# Patient Record
Sex: Female | Born: 1970
Health system: Southern US, Community
[De-identification: ages and names within clinical notes are randomized; demographics above are authoritative.]

## PROBLEM LIST (undated history)

## (undated) DIAGNOSIS — J40 Bronchitis, not specified as acute or chronic: Secondary | ICD-10-CM

## (undated) DIAGNOSIS — G43909 Migraine, unspecified, not intractable, without status migrainosus: Secondary | ICD-10-CM

## (undated) DIAGNOSIS — I10 Essential (primary) hypertension: Secondary | ICD-10-CM

## (undated) DIAGNOSIS — F32A Depression, unspecified: Secondary | ICD-10-CM

## (undated) DIAGNOSIS — E119 Type 2 diabetes mellitus without complications: Secondary | ICD-10-CM

## (undated) HISTORY — DX: Depression, unspecified: F32.A

## (undated) HISTORY — DX: Type 2 diabetes mellitus without complications: E11.9

## (undated) HISTORY — PX: ABDOMINAL HYSTERECTOMY: SHX81

## (undated) HISTORY — PX: TUMOR EXCISION: SHX421

---

## 1995-02-12 DIAGNOSIS — G43809 Other migraine, not intractable, without status migrainosus: Secondary | ICD-10-CM | POA: Insufficient documentation

## 2010-09-13 DIAGNOSIS — I10 Essential (primary) hypertension: Secondary | ICD-10-CM | POA: Insufficient documentation

## 2013-12-17 ENCOUNTER — Emergency Department: Payer: Self-pay | Admitting: Emergency Medicine

## 2014-01-10 ENCOUNTER — Emergency Department (HOSPITAL_COMMUNITY)
Admission: EM | Admit: 2014-01-10 | Discharge: 2014-01-10 | Disposition: A | Discharge status: Home / Self Care | Payer: Managed Care, Other (non HMO) | Attending: Emergency Medicine | Admitting: Emergency Medicine

## 2014-01-10 ENCOUNTER — Encounter (HOSPITAL_COMMUNITY): Payer: Self-pay | Admitting: Emergency Medicine

## 2014-01-10 DIAGNOSIS — F17200 Nicotine dependence, unspecified, uncomplicated: Secondary | ICD-10-CM | POA: Insufficient documentation

## 2014-01-10 DIAGNOSIS — I10 Essential (primary) hypertension: Secondary | ICD-10-CM | POA: Insufficient documentation

## 2014-01-10 DIAGNOSIS — G43909 Migraine, unspecified, not intractable, without status migrainosus: Secondary | ICD-10-CM

## 2014-01-10 HISTORY — DX: Essential (primary) hypertension: I10

## 2014-01-10 MED ORDER — HYDROCODONE-ACETAMINOPHEN 5-325 MG PO TABS
1.0000 | ORAL_TABLET | Freq: Once | ORAL | Status: AC
  Administered 2014-01-10: 1 via ORAL
  Filled 2014-01-10: qty 1

## 2014-01-10 MED ORDER — HYDROCODONE-ACETAMINOPHEN 5-325 MG PO TABS: ORAL_TABLET | ORAL | Status: DC

## 2014-01-10 MED ORDER — METOCLOPRAMIDE HCL 5 MG/ML IJ SOLN
10.0000 mg | Freq: Once | INTRAMUSCULAR | Status: AC
  Administered 2014-01-10: 10 mg via INTRAMUSCULAR
  Filled 2014-01-10: qty 2

## 2014-01-10 MED ORDER — KETOROLAC TROMETHAMINE 60 MG/2ML IM SOLN
60.0000 mg | Freq: Once | INTRAMUSCULAR | Status: AC
  Administered 2014-01-10: 60 mg via INTRAMUSCULAR
  Filled 2014-01-10: qty 2

## 2014-01-10 MED ORDER — OXYCODONE-ACETAMINOPHEN 5-325 MG PO TABS: 1.0000 | ORAL_TABLET | Freq: Once | ORAL | Status: DC

## 2014-01-10 MED ORDER — DIPHENHYDRAMINE HCL 25 MG PO CAPS
25.0000 mg | ORAL_CAPSULE | Freq: Once | ORAL | Status: AC
  Administered 2014-01-10: 25 mg via ORAL
  Filled 2014-01-10: qty 1

## 2014-01-10 NOTE — Discharge Instructions (Signed)
Migraine Headache A migraine headache is an intense, throbbing pain on one or both sides of your head. A migraine can last for 30 minutes to several hours. CAUSES  The exact cause of a migraine headache is not always known. However, a migraine may be caused when nerves in the brain become irritated and release chemicals that cause inflammation. This causes pain. Certain things may also trigger migraines, such as:  Alcohol.  Smoking.  Stress.  Menstruation.  Aged cheeses.  Foods or drinks that contain nitrates, glutamate, aspartame, or tyramine.  Lack of sleep.  Chocolate.  Caffeine.  Hunger.  Physical exertion.  Fatigue.  Medicines used to treat chest pain (nitroglycerine), birth control pills, estrogen, and some blood pressure medicines. SIGNS AND SYMPTOMS  Pain on one or both sides of your head.  Pulsating or throbbing pain.  Severe pain that prevents daily activities.  Pain that is aggravated by any physical activity.  Nausea, vomiting, or both.  Dizziness.  Pain with exposure to bright lights, loud noises, or activity.  General sensitivity to bright lights, loud noises, or smells. Before you get a migraine, you may get warning signs that a migraine is coming (aura). An aura may include:  Seeing flashing lights.  Seeing bright spots, halos, or zig-zag lines.  Having tunnel vision or blurred vision.  Having feelings of numbness or tingling.  Having trouble talking.  Having muscle weakness. DIAGNOSIS  A migraine headache is often diagnosed based on:  Symptoms.  Physical exam.  A CT scan or MRI of your head. These imaging tests cannot diagnose migraines, but they can help rule out other causes of headaches. TREATMENT Medicines may be given for pain and nausea. Medicines can also be given to help prevent recurrent migraines.  HOME CARE INSTRUCTIONS  Only take over-the-counter or prescription medicines for pain or discomfort as directed by your  health care provider. The use of long-term narcotics is not recommended.  Lie down in a dark, quiet room when you have a migraine.  Keep a journal to find out what may trigger your migraine headaches. For example, write down:  What you eat and drink.  How much sleep you get.  Any change to your diet or medicines.  Limit alcohol consumption.  Quit smoking if you smoke.  Get 7 9 hours of sleep, or as recommended by your health care provider.  Limit stress.  Keep lights dim if bright lights bother you and make your migraines worse. SEEK IMMEDIATE MEDICAL CARE IF:   Your migraine becomes severe.  You have a fever.  You have a stiff neck.  You have vision loss.  You have muscular weakness or loss of muscle control.  You start losing your balance or have trouble walking.  You feel faint or pass out.  You have severe symptoms that are different from your first symptoms. MAKE SURE YOU:   Understand these instructions.  Will watch your condition.  Will get help right away if you are not doing well or get worse. Document Released: 08/30/2005 Document Revised: 06/20/2013 Document Reviewed: 05/07/2013 [employer] Patient Information 2014 [employer].  

## 2014-01-10 NOTE — ED Notes (Signed)
C/o over past 2 months headache with no relief to meds. Sensitive to light/sound. Dizziness at times as well. No migraine past 10 years per pt.

## 2014-01-11 NOTE — ED Provider Notes (Signed)
CSN: [id number]     Arrival date & time 01/10/14  1106 History   First MD Initiated Contact with Patient 01/10/14 1201     Chief Complaint  Patient presents with  . Migraine     (Consider location/radiation/quality/duration/timing/severity/associated sxs/prior Treatment) Patient is a 43 y.o. female presenting with migraines. The history is provided by the patient.  Migraine This is a recurrent problem. The current episode started 1 to 4 weeks ago. The problem occurs intermittently. The problem has been waxing and waning. Associated symptoms include headaches and nausea. Pertinent negatives include no abdominal pain, anorexia, chest pain, chills, congestion, coughing, diaphoresis, fever, joint swelling, myalgias, neck pain, numbness, rash, sore throat, swollen glands, urinary symptoms, visual change, vomiting or weakness. Exacerbated by: patient reports increased stress recently.   She has tried NSAIDs, lying down, position changes and ice for the symptoms. The treatment provided mild relief.   Patient with hx of migraine headaches c/o waxing and waning headaches for one month or more.  States headaches feels similar to previous headaches.  She reported increased stress recently at her place of employment .  She denies visual changes, fever, neck pain or stiffness, or numbness or weakness of the extremities.   Past Medical History  Diagnosis Date  . Hypertension    Past Surgical History  Procedure Laterality Date  . Abdominal hysterectomy     No family history on file. History  Substance Use Topics  . Smoking status: Light Tobacco Smoker    Types: Cigarettes  . Smokeless tobacco: Not on file  . Alcohol Use: Yes     Comment: socailly   OB History   Grav Para Term Preterm Abortions TAB SAB Ect Mult Living                 Review of Systems  Constitutional: Negative for fever, chills, diaphoresis, activity change and appetite change.  HENT: Negative for congestion, facial swelling,  sore throat and trouble swallowing.   Eyes: Positive for photophobia. Negative for pain and visual disturbance.  Respiratory: Negative for cough and shortness of breath.   Cardiovascular: Negative for chest pain.  Gastrointestinal: Positive for nausea. Negative for vomiting, abdominal pain and anorexia.  Musculoskeletal: Negative for joint swelling, myalgias, neck pain and neck stiffness.  Skin: Negative for rash and wound.  Neurological: Positive for headaches. Negative for dizziness, facial asymmetry, speech difficulty, weakness and numbness.  Psychiatric/Behavioral: Negative for confusion and decreased concentration.  All other systems reviewed and are negative.     Allergies  Review of patient's allergies indicates no known allergies.  Home Medications   Prior to Admission medications   Medication Sig Start Date End Date Taking? Authorizing Provider  Aspirin-Acetaminophen-Caffeine (GOODY HEADACHE PO) Take 1 Package by mouth daily as needed (migraine).   Yes Historical Provider, MD  butalbital-acetaminophen-caffeine (FIORICET, ESGIC) 50-325-40 MG per tablet Take 1 tablet by mouth 2 (two) times daily as needed for headache.   Yes Historical Provider, MD  ibuprofen (ADVIL,MOTRIN) 200 MG tablet Take 800 mg by mouth every 6 (six) hours as needed for headache.   Yes Historical Provider, MD  HYDROcodone-acetaminophen (NORCO/VICODIN) 5-325 MG per tablet Take one-two tabs po q 4-6 hrs prn pain 01/10/14   Cambree Hendrix L. Lanisa Ishler, PA-C   BP 101/74  Pulse 62  Temp(Src) 98.4 F (36.9 C) (Oral)  Resp 16  Ht 5\' 6"  (1.676 m)  Wt 215 lb (97.523 kg)  BMI 34.72 kg/m2  SpO2 99% Physical Exam  Nursing note and vitals reviewed.  Constitutional: She is oriented to person, place, and time. She appears well-developed and well-nourished. No distress.  HENT:  Head: Normocephalic and atraumatic.  Mouth/Throat: Oropharynx is clear and moist.  Eyes: EOM are normal. Pupils are equal, round, and reactive to  light.  Neck: Normal range of motion and phonation normal. Neck supple. No spinous process tenderness and no muscular tenderness present. No rigidity. No Brudzinski's sign and no Kernig's sign noted.  Cardiovascular: Normal rate, regular rhythm, normal heart sounds and intact distal pulses.   No murmur heard. Pulmonary/Chest: Effort normal and breath sounds normal. No respiratory distress.  Musculoskeletal: Normal range of motion.  Neurological: She is alert and oriented to person, place, and time. She has normal strength. No cranial nerve deficit or sensory deficit. She exhibits normal muscle tone. Coordination and gait normal. GCS eye subscore is 4. GCS verbal subscore is 5. GCS motor subscore is 6.  Reflex Scores:      Tricep reflexes are 2+ on the right side and 2+ on the left side.      Bicep reflexes are 2+ on the right side and 2+ on the left side.      Patellar reflexes are 2+ on the right side and 2+ on the left side.      Achilles reflexes are 2+ on the right side and 2+ on the left side. Skin: Skin is warm and dry.  Psychiatric: She has a normal mood and affect.    ED Course  Procedures (including critical care time) Labs Review Labs Reviewed - No data to display  Imaging Review No results found.   EKG Interpretation None      MDM   Final diagnoses:  Migraine headache   Vitals stable,  Pt is non-toxic appearing.  No focal neuro deficits, no meningeal signs.  Intermittent headache of gradual onset that is similar to previous.  [facility].  Pain improved after medication and appears stable for d/c.  Patient agrees to close f/u with PMD or to return here if the symptoms worsen.     Sparrow [place] L. Trisha Mangleriplett, PA-C 01/11/14 2238

## 2014-01-14 NOTE — ED Provider Notes (Signed)
Medical screening examination/treatment/procedure(s) were performed by non-physician practitioner and as supervising physician I was immediately available for consultation/collaboration.   EKG Interpretation None        Daton Szilagyi L Rainelle Sulewski, MD 01/14/14 1513 

## 2014-01-31 ENCOUNTER — Emergency Department: Payer: Self-pay | Admitting: Emergency Medicine

## 2014-02-28 ENCOUNTER — Emergency Department (HOSPITAL_COMMUNITY)
Admission: EM | Admit: 2014-02-28 | Discharge: 2014-02-28 | Disposition: A | Discharge status: Home / Self Care | Payer: Managed Care, Other (non HMO) | Attending: Emergency Medicine | Admitting: Emergency Medicine

## 2014-02-28 ENCOUNTER — Encounter (HOSPITAL_COMMUNITY): Payer: Self-pay | Admitting: Emergency Medicine

## 2014-02-28 ENCOUNTER — Emergency Department (HOSPITAL_COMMUNITY): Payer: Managed Care, Other (non HMO)

## 2014-02-28 DIAGNOSIS — J40 Bronchitis, not specified as acute or chronic: Secondary | ICD-10-CM

## 2014-02-28 DIAGNOSIS — I10 Essential (primary) hypertension: Secondary | ICD-10-CM | POA: Insufficient documentation

## 2014-02-28 DIAGNOSIS — F17200 Nicotine dependence, unspecified, uncomplicated: Secondary | ICD-10-CM | POA: Insufficient documentation

## 2014-02-28 DIAGNOSIS — Z79899 Other long term (current) drug therapy: Secondary | ICD-10-CM | POA: Insufficient documentation

## 2014-02-28 DIAGNOSIS — J0100 Acute maxillary sinusitis, unspecified: Secondary | ICD-10-CM | POA: Insufficient documentation

## 2014-02-28 DIAGNOSIS — J209 Acute bronchitis, unspecified: Secondary | ICD-10-CM | POA: Insufficient documentation

## 2014-02-28 HISTORY — DX: Migraine, unspecified, not intractable, without status migrainosus: G43.909

## 2014-02-28 HISTORY — DX: Bronchitis, not specified as acute or chronic: J40

## 2014-02-28 MED ORDER — IPRATROPIUM-ALBUTEROL 0.5-2.5 (3) MG/3ML IN SOLN
3.0000 mL | Freq: Once | RESPIRATORY_TRACT | Status: AC
  Administered 2014-02-28: 3 mL via RESPIRATORY_TRACT
  Filled 2014-02-28: qty 3

## 2014-02-28 MED ORDER — HYDROCODONE-ACETAMINOPHEN 5-325 MG PO TABS: ORAL_TABLET | ORAL | Status: DC

## 2014-02-28 MED ORDER — AMOXICILLIN-POT CLAVULANATE 875-125 MG PO TABS: 1.0000 | ORAL_TABLET | Freq: Two times a day (BID) | ORAL | Status: DC

## 2014-02-28 MED ORDER — HYDROCOD POLST-CHLORPHEN POLST 10-8 MG/5ML PO LQCR
5.0000 mL | Freq: Once | ORAL | Status: AC
  Administered 2014-02-28: 5 mL via ORAL

## 2014-02-28 MED ORDER — HYDROCOD POLST-CHLORPHEN POLST 10-8 MG/5ML PO LQCR
ORAL | Status: AC
  Filled 2014-02-28: qty 5

## 2014-02-28 MED ORDER — PREDNISONE 10 MG PO KIT: PACK | ORAL | Status: DC

## 2014-02-28 NOTE — ED Provider Notes (Signed)
CSN: [id number]     Arrival date & time 02/28/14  1026 History   First MD Initiated Contact with Patient 02/28/14 1036     Chief Complaint  Patient presents with  . Nasal Congestion  . Headache  . Cough     (Consider location/radiation/quality/duration/timing/severity/associated sxs/prior Treatment) Patient is a 43 y.o. female presenting with URI. The history is provided by the patient.  URI Presenting symptoms: congestion, cough, fever and sore throat   Presenting symptoms: no ear pain   Severity:  Moderate Onset quality:  Gradual Duration:  3 days Timing:  Constant Progression:  Worsening Chronicity:  New Relieved by:  Nothing Worsened by:  Nothing tried Ineffective treatments:  Decongestant, OTC medications and drinking Associated symptoms: headaches, myalgias, sinus pain, sneezing and wheezing   Risk factors: no diabetes mellitus, no recent travel and no sick contacts    Christinamarie Tall is a 43 y.o. female who presents to the ED with nasal congestion, sinus pressure, sore throat and a cough x 3 days. She reports that the cough is productive. She did not sleep last night for coughing. She has a sinus headache. She denies nausea or vomiting.  Past Medical History  Diagnosis Date  . Hypertension    Past Surgical History  Procedure Laterality Date  . Abdominal hysterectomy     No family history on file. History  Substance Use Topics  . Smoking status: Light Tobacco Smoker    Types: Cigarettes  . Smokeless tobacco: Not on file  . Alcohol Use: Yes     Comment: socailly   OB History   Grav Para Term Preterm Abortions TAB SAB Ect Mult Living                 Review of Systems  Constitutional: Positive for fever. Negative for chills.  HENT: Positive for congestion, sinus pressure, sneezing and sore throat. Negative for ear discharge, ear pain, facial swelling and trouble swallowing.   Eyes: Positive for redness and itching. Negative for visual disturbance.  Respiratory:  Positive for cough, chest tightness, shortness of breath and wheezing.   Cardiovascular: Negative for chest pain.  Musculoskeletal: Positive for myalgias.  Skin: Negative for rash.  Neurological: Positive for headaches.  Psychiatric/Behavioral: Negative for confusion. The patient is not nervous/anxious.       Allergies  Review of patient's allergies indicates no known allergies.  Home Medications   Prior to Admission medications   Medication Sig Start Date End Date Taking? Authorizing Provider  Aspirin-Acetaminophen-Caffeine (GOODY HEADACHE PO) Take 1 Package by mouth daily as needed (migraine).    Historical Provider, MD  butalbital-acetaminophen-caffeine (FIORICET, ESGIC) 50-325-40 MG per tablet Take 1 tablet by mouth 2 (two) times daily as needed for headache.    Historical Provider, MD  HYDROcodone-acetaminophen (NORCO/VICODIN) 5-325 MG per tablet Take one-two tabs po q 4-6 hrs prn pain 01/10/14   Tammy L. Triplett, PA-C  ibuprofen (ADVIL,MOTRIN) 200 MG tablet Take 800 mg by mouth every 6 (six) hours as needed for headache.    Historical Provider, MD   BP 138/80  Pulse 92  Temp(Src) 98.2 F (36.8 C) (Oral)  Resp 17  Ht _0  (1.676 m)  Wt 198 lb (89.812 kg)  BMI 31.97 kg/m2  SpO2 98% Physical Exam  Nursing note and vitals reviewed. Constitutional: She is oriented to person, place, and time. She appears well-developed and well-nourished. No distress.  HENT:  Head: Normocephalic and atraumatic.  Right Ear: Tympanic membrane normal.  Left Ear: Tympanic membrane normal.  Nose: Mucosal edema and rhinorrhea present. Right sinus exhibits maxillary sinus tenderness and frontal sinus tenderness. Left sinus exhibits maxillary sinus tenderness and frontal sinus tenderness.  Mouth/Throat: Uvula is midline and mucous membranes are normal. Posterior oropharyngeal erythema present.  Eyes: Conjunctivae and EOM are normal.  Neck: Neck supple.  Cardiovascular: Normal rate and regular  rhythm.   Pulmonary/Chest: Effort normal. She has decreased breath sounds. Wheezes: occasional. She has rhonchi.  Abdominal: Soft. There is no tenderness.  Musculoskeletal: Normal range of motion.  Neurological: She is alert and oriented to person, place, and time. No cranial nerve deficit.  Skin: Skin is warm and dry.  Psychiatric: She has a normal mood and affect. Her behavior is normal.    ED Course  Procedures  Dg Chest 2 View  02/28/2014   CLINICAL DATA:  Pain.  Cough.  EXAM: CHEST  2 VIEW  COMPARISON:  None.  FINDINGS: Mediastinum and hilar structures are normal. Borderline cardiomegaly with normal pulmonary vascularity. No focal pulmonary infiltrate. No pleural effusion or pneumothorax. No acute bony abnormality .  IMPRESSION: Borderline cardiomegaly, otherwise negative chest.   Electronically Signed   By: Marcello Moores  Register   On: 02/28/2014 11:09   Albuterol/atrovent neb treatment patient states she doesn't feel much different.   MDM  43 y.o. female with sinusitis and bronchitis. Stable for discharge without fever. I have reviewed this patient's vital signs, nurses notes, appropriate imaging and discussed findings and plan of care with the patient. She voices understanding and agrees to plan. She will return for worsening symptoms.    Medication List    STOP taking these medications       GOODY HEADACHE PO      TAKE these medications       amoxicillin-clavulanate 875-125 MG per tablet  Commonly known as:  AUGMENTIN  Take 1 tablet by mouth 2 (two) times daily.     HYDROcodone-acetaminophen 5-325 MG per tablet  Commonly known as:  NORCO/VICODIN  Take one tablet PO every 4 to 6 hours as needed for cough.     PredniSONE 10 MG Kit  Take 6 tablets PO today then 5, 4, 3, 2, 1      ASK your doctor about these medications       acetaminophen 500 MG tablet  Commonly known as:  TYLENOL  Take 1,000 mg by mouth 2 (two) times daily.     fexofenadine 180 MG tablet  Commonly  known as:  ALLEGRA  Take 180 mg by mouth daily.     VISINE OP  Apply 2 drops to eye daily as needed (burning/itching).          [place], NP 02/28/14 1239

## 2014-02-28 NOTE — ED Provider Notes (Signed)
  Medical screening examination/treatment/procedure(s) were performed by non-physician practitioner and as supervising physician I was immediately available for consultation/collaboration.   EKG Interpretation None            Jaclynn Laumann, MD 02/28/14 1404 

## 2014-02-28 NOTE — ED Notes (Signed)
Patient c/o nasal congestion/sinus pressure with sore throat and cough x3 days. Patient reports cough is intermittently productive. Patient also reports headache x1 week. Denies any nausea or vomiting.

## 2014-02-28 NOTE — ED Notes (Signed)
Pt with headache X 1 week. Also complaining of nasal congestion x 2 weeks. Has tried many over the counter remedies but has not experienced any relief. Complains of pressure under eyes and around sinuses as well.

## 2014-02-28 NOTE — Discharge Instructions (Signed)
Do not take the hydrocodone if you are driving as it will make you sleepy.  Return as needed.

## 2014-10-29 ENCOUNTER — Emergency Department: Payer: Self-pay | Admitting: Emergency Medicine

## 2015-01-24 ENCOUNTER — Emergency Department (HOSPITAL_COMMUNITY)
Admission: EM | Admit: 2015-01-24 | Discharge: 2015-01-24 | Disposition: A | Discharge status: Home / Self Care | Payer: Managed Care, Other (non HMO) | Attending: Emergency Medicine | Admitting: Emergency Medicine

## 2015-01-24 ENCOUNTER — Encounter (HOSPITAL_COMMUNITY): Payer: Self-pay | Admitting: *Deleted

## 2015-01-24 DIAGNOSIS — K029 Dental caries, unspecified: Secondary | ICD-10-CM | POA: Diagnosis not present

## 2015-01-24 DIAGNOSIS — J0100 Acute maxillary sinusitis, unspecified: Secondary | ICD-10-CM | POA: Insufficient documentation

## 2015-01-24 DIAGNOSIS — R000 Tachycardia, unspecified: Secondary | ICD-10-CM | POA: Diagnosis not present

## 2015-01-24 DIAGNOSIS — Z79899 Other long term (current) drug therapy: Secondary | ICD-10-CM | POA: Diagnosis not present

## 2015-01-24 DIAGNOSIS — I10 Essential (primary) hypertension: Secondary | ICD-10-CM | POA: Insufficient documentation

## 2015-01-24 DIAGNOSIS — Z87891 Personal history of nicotine dependence: Secondary | ICD-10-CM | POA: Diagnosis not present

## 2015-01-24 DIAGNOSIS — R51 Headache: Secondary | ICD-10-CM | POA: Diagnosis present

## 2015-01-24 MED ORDER — LORATADINE-PSEUDOEPHEDRINE ER 5-120 MG PO TB12: 1.0000 | ORAL_TABLET | Freq: Two times a day (BID) | ORAL | Status: DC

## 2015-01-24 MED ORDER — HYDROCODONE-ACETAMINOPHEN 5-325 MG PO TABS: 1.0000 | ORAL_TABLET | ORAL | Status: DC | PRN

## 2015-01-24 MED ORDER — AMOXICILLIN-POT CLAVULANATE 875-125 MG PO TABS
1.0000 | ORAL_TABLET | Freq: Once | ORAL | Status: AC
  Administered 2015-01-24: 1 via ORAL
  Filled 2015-01-24: qty 1

## 2015-01-24 MED ORDER — ONDANSETRON HCL 4 MG PO TABS
4.0000 mg | ORAL_TABLET | Freq: Once | ORAL | Status: AC
  Administered 2015-01-24: 4 mg via ORAL
  Filled 2015-01-24: qty 1

## 2015-01-24 MED ORDER — HYDROCODONE-ACETAMINOPHEN 5-325 MG PO TABS
2.0000 | ORAL_TABLET | Freq: Once | ORAL | Status: AC
  Administered 2015-01-24: 2 via ORAL
  Filled 2015-01-24: qty 2

## 2015-01-24 MED ORDER — AMOXICILLIN-POT CLAVULANATE 875-125 MG PO TABS: 1.0000 | ORAL_TABLET | Freq: Two times a day (BID) | ORAL | Status: DC

## 2015-01-24 MED ORDER — DEXAMETHASONE 4 MG PO TABS: 4.0000 mg | ORAL_TABLET | Freq: Two times a day (BID) | ORAL | Status: DC

## 2015-01-24 MED ORDER — PREDNISONE 50 MG PO TABS
60.0000 mg | ORAL_TABLET | Freq: Once | ORAL | Status: AC
  Administered 2015-01-24: 60 mg via ORAL
  Filled 2015-01-24 (×2): qty 1

## 2015-01-24 NOTE — Discharge Instructions (Signed)
Please increase fluids. Please use Claritin-D and Decadron daily for your sinus related problem. Please use Augmentin 2 times daily. Please take these medications with food. Please see the your nose and throat specialist listed above, or the ear nose and throat specialist of your choice for an outpatient evaluation of your sinuses. May use Norco for pain. This medication may cause drowsiness, please use with caution. There are multiple dental caries present. Please see your dentist as sone as possible to have these evaluated and managed. Sinusitis Sinusitis is redness, soreness, and inflammation of the paranasal sinuses. Paranasal sinuses are air pockets within the bones of your face (beneath the eyes, the middle of the forehead, or above the eyes). In healthy paranasal sinuses, mucus is able to drain out, and air is able to circulate through them by way of your nose. However, when your paranasal sinuses are inflamed, mucus and air can become trapped. This can allow bacteria and other germs to grow and cause infection. Sinusitis can develop quickly and last only a short time (acute) or continue over a long period (chronic). Sinusitis that lasts for more than 12 weeks is considered chronic.  CAUSES  Causes of sinusitis include:  Allergies.  Structural abnormalities, such as displacement of the cartilage that separates your nostrils (deviated septum), which can decrease the air flow through your nose and sinuses and affect sinus drainage.  Functional abnormalities, such as when the small hairs (cilia) that line your sinuses and help remove mucus do not work properly or are not present. SIGNS AND SYMPTOMS  Symptoms of acute and chronic sinusitis are the same. The primary symptoms are pain and pressure around the affected sinuses. Other symptoms include:  Upper toothache.  Earache.  Headache.  Bad breath.  Decreased sense of smell and taste.  A cough, which worsens when you are lying  flat.  Fatigue.  Fever.  Thick drainage from your nose, which often is green and may contain pus (purulent).  Swelling and warmth over the affected sinuses. DIAGNOSIS  Your health care provider will perform a physical exam. During the exam, your health care provider may:  Look in your nose for signs of abnormal growths in your nostrils (nasal polyps).  Tap over the affected sinus to check for signs of infection.  View the inside of your sinuses (endoscopy) using an imaging device that has a light attached (endoscope). If your health care provider suspects that you have chronic sinusitis, one or more of the following tests may be recommended:  Allergy tests.  Nasal culture. A sample of mucus is taken from your nose, sent to a lab, and screened for bacteria.  Nasal cytology. A sample of mucus is taken from your nose and examined by your health care provider to determine if your sinusitis is related to an allergy. TREATMENT  Most cases of acute sinusitis are related to a viral infection and will resolve on their own within 10 days. Sometimes medicines are prescribed to help relieve symptoms (pain medicine, decongestants, nasal steroid sprays, or saline sprays).  However, for sinusitis related to a bacterial infection, your health care provider will prescribe antibiotic medicines. These are medicines that will help kill the bacteria causing the infection.  Rarely, sinusitis is caused by a fungal infection. In theses cases, your health care provider will prescribe antifungal medicine. For some cases of chronic sinusitis, surgery is needed. Generally, these are cases in which sinusitis recurs more than 3 times per year, despite other treatments. HOME CARE INSTRUCTIONS  Drink plenty of water. Water helps thin the mucus so your sinuses can drain more easily.  Use a humidifier.  Inhale steam 3 to 4 times a day (for example, sit in the bathroom with the shower running).  Apply a warm,  moist washcloth to your face 3 to 4 times a day, or as directed by your health care provider.  Use saline nasal sprays to help moisten and clean your sinuses.  Take medicines only as directed by your health care provider.  If you were prescribed either an antibiotic or antifungal medicine, finish it all even if you start to feel better. SEEK IMMEDIATE MEDICAL CARE IF:  You have increasing pain or severe headaches.  You have nausea, vomiting, or drowsiness.  You have swelling around your face.  You have vision problems.  You have a stiff neck.  You have difficulty breathing. MAKE SURE YOU:   Understand these instructions.  Will watch your condition.  Will get help right away if you are not doing well or get worse. Document Released: 08/30/2005 Document Revised: 01/14/2014 Document Reviewed: 09/14/2011 [facility] Patient Information 2015 [place], [place]. This information is not intended to replace advice given to you by your health care provider. Make sure you discuss any questions you have with your health care provider.  Dental Caries Dental caries is tooth decay. This decay can cause a hole in teeth (cavity) that can get bigger and deeper over time. HOME CARE  Brush and floss your teeth. Do this at least two times a day.  Use a fluoride toothpaste.  Use a mouth rinse if told by your dentist or doctor.  Eat less sugary and starchy foods. Drink less sugary drinks.  Avoid snacking often on sugary and starchy foods. Avoid sipping often on sugary drinks.  Keep regular checkups and cleanings with your dentist.  Use fluoride supplements if told by your dentist or doctor.  Allow fluoride to be applied to teeth if told by your dentist or doctor. Document Released: 06/08/2008 Document Revised: 01/14/2014 Document Reviewed: 09/01/2012 [facility] Patient Information 2015 [place], [place]. This information is not intended to replace advice given to you by your health care provider.  Make sure you discuss any questions you have with your health care provider.

## 2015-01-24 NOTE — ED Provider Notes (Signed)
CSN: [id number]     Arrival date & time 01/24/15  1100 History   First MD Initiated Contact with Patient 01/24/15 1154     Chief Complaint  Patient presents with  . Facial Pain     (Consider location/radiation/quality/duration/timing/severity/associated sxs/prior Treatment) HPI Comments: Patient presents to the emergency department with complaint of nasal congestion, facial pain, cough, and sneezing. The patient states she has had problems with sinuses and allergies in the past. She has not had any temperature elevations reported. There's been no blood in the mucus. She has noted temperature elevation on 3 days ago. She's not had any operations or procedures involving the sinuses or face.  The history is provided by the patient.    Past Medical History  Diagnosis Date  . Hypertension   . Bronchitis   . Migraine    Past Surgical History  Procedure Laterality Date  . Abdominal hysterectomy     Family History  Problem Relation Age of Onset  . Diabetes Other    History  Substance Use Topics  . Smoking status: Former Smoker -- 3 years    Types: Cigarettes  . Smokeless tobacco: Never Used  . Alcohol Use: Yes     Comment: socailly   OB History    No data available     Review of Systems  Constitutional: Positive for fever. Negative for activity change.       All ROS Neg except as noted in HPI  HENT: Positive for congestion, postnasal drip, rhinorrhea and sinus pressure. Negative for nosebleeds.   Eyes: Negative for photophobia and discharge.  Respiratory: Negative for cough, shortness of breath and wheezing.   Cardiovascular: Negative for chest pain and palpitations.  Gastrointestinal: Negative for abdominal pain and blood in stool.  Genitourinary: Negative for dysuria, frequency and hematuria.  Musculoskeletal: Negative for back pain, arthralgias and neck pain.  Skin: Negative.   Neurological: Negative for dizziness, seizures and speech difficulty.   Psychiatric/Behavioral: Negative for hallucinations and confusion.  All other systems reviewed and are negative.     Allergies  Review of patient's allergies indicates no known allergies.  Home Medications   Prior to Admission medications   Medication Sig Start Date End Date Taking? Authorizing Provider  acetaminophen (TYLENOL) 500 MG tablet Take 1,000 mg by mouth daily as needed for headache.    Yes Historical Provider, MD  diphenhydrAMINE (BENADRYL) 25 MG tablet Take 25 mg by mouth every 6 (six) hours as needed (allergic reaction).   Yes Historical Provider, MD  naproxen sodium (ANAPROX) 220 MG tablet Take 220 mg by mouth daily as needed (pain).   Yes Historical Provider, MD  Phenylephrine-DM-GG-APAP 5-10-200-325 MG CAPS Take 2 tablets by mouth daily as needed (allergies).   Yes Historical Provider, MD  pseudoephedrine (SUDAFED) 30 MG tablet Take 30 mg by mouth every 4 (four) hours as needed for congestion.   Yes Historical Provider, MD  Tetrahydrozoline HCl (VISINE OP) Apply 2 drops to eye daily as needed (burning/itching).   Yes Historical Provider, MD  amoxicillin-clavulanate (AUGMENTIN) 875-125 MG per tablet Take 1 tablet by mouth 2 (two) times daily. Patient not taking: Reported on 01/24/2015 02/28/14   Ashley Murrain, NP  HYDROcodone-acetaminophen (NORCO/VICODIN) 5-325 MG per tablet Take one tablet PO every 4 to 6 hours as needed for cough. Patient not taking: Reported on 01/24/2015 02/28/14   Ashley Murrain, NP  PredniSONE 10 MG KIT Take 6 tablets PO today then 5, 4, 3, 2, 1 Patient not taking: Reported  on 01/24/2015 02/28/14   Hope Bunnie Pion, NP   BP 135/62 mmHg  Pulse 80  Temp(Src) 98.2 F (36.8 C) (Oral)  Resp 16  Ht _0  (1.676 m)  Wt 185 lb (83.915 kg)  BMI 29.87 kg/m2  SpO2 100% Physical Exam  Constitutional: She is oriented to person, place, and time. She appears well-developed and well-nourished.  Non-toxic appearance.  HENT:  Head: Normocephalic.  Right Ear: Tympanic  membrane and external ear normal.  Left Ear: Tympanic membrane and external ear normal.  There is pain to percussion of the left maxillary sinus area. There is mild nasal congestion present. There no hot areas over the right or left frontal, or maxillary sinuses.  There is mild increased redness of the posterior pharynx, no exudate noted. Uvula is in the midline.  There are multiple dental caries of the upper and lower jaw on the right and the left. No visible abscess appreciated.  Eyes: EOM and lids are normal. Pupils are equal, round, and reactive to light.  Neck: Normal range of motion. Neck supple. Carotid bruit is not present.  Cardiovascular: Normal rate, regular rhythm, intact distal pulses and normal pulses.   Murmur heard. Pulmonary/Chest: Breath sounds normal. No respiratory distress.  Abdominal: Soft. Bowel sounds are normal. There is no tenderness. There is no guarding.  Musculoskeletal: Normal range of motion.  Lymphadenopathy:       Head (right side): No submandibular adenopathy present.       Head (left side): No submandibular adenopathy present.    She has no cervical adenopathy.  Neurological: She is alert and oriented to person, place, and time. She has normal strength. No cranial nerve deficit or sensory deficit.  Skin: Skin is warm and dry.  Psychiatric: She has a normal mood and affect. Her speech is normal.  Nursing note and vitals reviewed.   ED Course  Procedures (including critical care time) Labs Review Labs Reviewed - No data to display  Imaging Review No results found.   EKG Interpretation None      MDM  Vital signs within normal limits. Patient has pain to percussion of the maxillary sinus area. Question sinus infection, versus dental issue. The airway is patent. There is no evidence for Ludwig's Angina. Patient will be treated with Decadron, Augmentin, Claritin-D, and Norco.    Final diagnoses:  None    **I have reviewed nursing notes, vital  signs, and all appropriate lab and imaging results for this patient.Lily Kocher, PA-C 01/24/15 1219  Fredia Sorrow, MD 01/29/15 1447

## 2015-01-24 NOTE — ED Notes (Signed)
Nasal congestion with lt periorbital swelling,   Fever  3 days ago.  Cough, sneezing

## 2016-04-21 ENCOUNTER — Encounter: Payer: Self-pay | Admitting: *Deleted

## 2016-04-21 ENCOUNTER — Emergency Department
Admission: EM | Admit: 2016-04-21 | Discharge: 2016-04-21 | Disposition: A | Discharge status: Home / Self Care | Payer: Commercial Managed Care - PPO | Attending: Emergency Medicine | Admitting: Emergency Medicine

## 2016-04-21 DIAGNOSIS — Z87891 Personal history of nicotine dependence: Secondary | ICD-10-CM | POA: Insufficient documentation

## 2016-04-21 DIAGNOSIS — I10 Essential (primary) hypertension: Secondary | ICD-10-CM | POA: Diagnosis not present

## 2016-04-21 DIAGNOSIS — K0889 Other specified disorders of teeth and supporting structures: Secondary | ICD-10-CM | POA: Diagnosis not present

## 2016-04-21 MED ORDER — AMOXICILLIN 500 MG PO CAPS: 500.0000 mg | ORAL_CAPSULE | Freq: Three times a day (TID) | ORAL | 0 refills | Status: DC

## 2016-04-21 MED ORDER — IBUPROFEN 600 MG PO TABS: 600.0000 mg | ORAL_TABLET | Freq: Three times a day (TID) | ORAL | 0 refills | Status: DC | PRN

## 2016-04-21 MED ORDER — LIDOCAINE VISCOUS 2 % MT SOLN
15.0000 mL | Freq: Once | OROMUCOSAL | Status: AC
  Administered 2016-04-21: 15 mL via OROMUCOSAL
  Filled 2016-04-21: qty 15

## 2016-04-21 MED ORDER — LIDOCAINE VISCOUS 2 % MT SOLN: 5.0000 mL | Freq: Four times a day (QID) | OROMUCOSAL | 0 refills | Status: AC | PRN

## 2016-04-21 MED ORDER — OXYCODONE-ACETAMINOPHEN 5-325 MG PO TABS: 1.0000 | ORAL_TABLET | Freq: Four times a day (QID) | ORAL | 0 refills | Status: AC | PRN

## 2016-04-21 MED ORDER — OXYCODONE-ACETAMINOPHEN 5-325 MG PO TABS
1.0000 | ORAL_TABLET | Freq: Once | ORAL | Status: AC
  Administered 2016-04-21: 1 via ORAL
  Filled 2016-04-21: qty 1

## 2016-04-21 MED ORDER — IBUPROFEN 600 MG PO TABS
600.0000 mg | ORAL_TABLET | Freq: Once | ORAL | Status: AC
Start: 2016-04-21 — End: 2016-04-21
  Administered 2016-04-21: 600 mg via ORAL
  Filled 2016-04-21: qty 1

## 2016-04-21 NOTE — ED Notes (Signed)
Pt to ed with c/o abscess to mouth and pain x 1 week.  Pt states she recently got insurance.  Swelling noted to gum area on right lower mouth.

## 2016-04-21 NOTE — ED Triage Notes (Signed)
Pt has a abscess surrounding tooth on right side of mouth

## 2016-04-21 NOTE — ED Provider Notes (Signed)
[facility] Emergency Department Provider Note   ____________________________________________   First MD Initiated Contact with Patient 04/21/16 [phone]     (approximate)  I have reviewed the triage vital signs and the nursing notes.   HISTORY  Chief Complaint Dental Problem    HPI Melanie Briggs is a 45 y.o. female complainof pain and dental pain secondary to abscess right lower molar area for one week. Patient stated pain has increased. Patient states she recently obtained health insurance and will follow-up with dentist if we can give her recommendation. Patient stated pain is not relieved with Tylenol. Patient rated the pain as a 10 over 10. No other palliative measures for complaint.   Past Medical History:  Diagnosis Date  . Bronchitis   . Hypertension   . Migraine     There are no active problems to display for this patient.   Past Surgical History:  Procedure Laterality Date  . ABDOMINAL HYSTERECTOMY      Prior to Admission medications   Medication Sig Start Date End Date Taking? Authorizing Provider  acetaminophen (TYLENOL) 500 MG tablet Take 1,000 mg by mouth daily as needed for headache.     Historical Provider, MD  amoxicillin (AMOXIL) 500 MG capsule Take 1 capsule (500 mg total) by mouth 3 (three) times daily. 04/21/16   Sable Feil, PA-C  amoxicillin-clavulanate (AUGMENTIN) 875-125 MG per tablet Take 1 tablet by mouth every 12 (twelve) hours. Take with food 01/24/15   Lily Kocher, PA-C  dexamethasone (DECADRON) 4 MG tablet Take 1 tablet (4 mg total) by mouth 2 (two) times daily with a meal. 01/24/15   Lily Kocher, PA-C  diphenhydrAMINE (BENADRYL) 25 MG tablet Take 25 mg by mouth every 6 (six) hours as needed (allergic reaction).    Historical Provider, MD  HYDROcodone-acetaminophen (NORCO/VICODIN) 5-325 MG per tablet Take 1 tablet by mouth every 4 (four) hours as needed. 01/24/15   Lily Kocher, PA-C  ibuprofen (ADVIL,MOTRIN) 600 MG  tablet Take 1 tablet (600 mg total) by mouth every 8 (eight) hours as needed. 04/21/16   Sable Feil, PA-C  lidocaine (XYLOCAINE) 2 % solution Use as directed 5 mLs in the mouth or throat every 6 (six) hours as needed for mouth pain. 04/21/16   Sable Feil, PA-C  loratadine-pseudoephedrine (CLARITIN-D 12 HOUR) 5-120 MG per tablet Take 1 tablet by mouth 2 (two) times daily. For congestion. 01/24/15   Lily Kocher, PA-C  naproxen sodium (ANAPROX) 220 MG tablet Take 220 mg by mouth daily as needed (pain).    Historical Provider, MD  oxyCODONE-acetaminophen (ROXICET) 5-325 MG tablet Take 1 tablet by mouth every 6 (six) hours as needed. 04/21/16 04/21/17  Sable Feil, PA-C  Phenylephrine-DM-GG-APAP 5-10-200-325 MG CAPS Take 2 tablets by mouth daily as needed (allergies).    Historical Provider, MD  PredniSONE 10 MG KIT Take 6 tablets PO today then 5, 4, 3, 2, 1 Patient not taking: Reported on 01/24/2015 02/28/14   Ashley Murrain, NP  pseudoephedrine (SUDAFED) 30 MG tablet Take 30 mg by mouth every 4 (four) hours as needed for congestion.    Historical Provider, MD  Tetrahydrozoline HCl (VISINE OP) Apply 2 drops to eye daily as needed (burning/itching).    Historical Provider, MD    Allergies Review of patient's allergies indicates no known allergies.  Family History  Problem Relation Age of Onset  . Diabetes Other     Social History Social History  Substance Use Topics  . Smoking  status: Former Smoker    Years: 3.00    Types: Cigarettes  . Smokeless tobacco: Never Used  . Alcohol use Yes     Comment: socailly    Review of Systems Constitutional: No fever/chills Eyes: No visual changes. ENT: No sore throat.Dental pain and swollen gums. Cardiovascular: Denies chest pain. Respiratory: Denies shortness of breath. Gastrointestinal: No abdominal pain.  No nausea, no vomiting.  No diarrhea.  No constipation. Genitourinary: Negative for dysuria. Musculoskeletal: Negative for back pain. Skin:  Negative for rash. Neurological: Negative for headaches, focal weakness or numbness.   ____________________________________________   PHYSICAL EXAM:  VITAL SIGNS: ED Triage Vitals  Enc Vitals Group     BP 04/21/16 0929 (!) 162/96     Pulse Rate 04/21/16 0929 90     Resp 04/21/16 0929 16     Temp 04/21/16 0929 98.7 F (37.1 C)     Temp Source 04/21/16 0929 Oral     SpO2 04/21/16 0929 98 %     Weight 04/21/16 0929 210 lb (95.3 kg)     Height 04/21/16 0929 5' 5"  (1.651 m)     Head Circumference --      Peak Flow --      Pain Score 04/21/16 0919 10     Pain Loc --      Pain Edu? --      Excl. in [place]? --     Constitutional: Alert and oriented. Well appearing and in no acute distress. Eyes: Conjunctivae are normal. PERRL. EOMI. Head: Atraumatic. Nose: No congestion/rhinnorhea. Mouth/Throat: Mucous membranes are moist.  Oropharynx non-erythematous. Neck: No stridor.  {o cervical spine tenderness to palpation Hematological/Lymphatic/Immunilogical: No cervical lymphadenopathy. Cardiovascular: Normal rate, regular rhythm. Grossly normal heart sounds.  Good peripheral circulation. Respiratory: Normal respiratory effort.  No retractions. Lungs CTAB. Gastrointestinal: Soft and nontender. No distention. No abdominal bruits. No CVA tenderness. Musculoskeletal: No lower extremity tenderness nor edema.  No joint effusions. Neurologic:  Normal speech and language. No gross focal neurologic deficits are appreciated. No gait instability. Skin:  Skin is warm, dry and intact. No rash noted. Psychiatric: Mood and affect are normal. Speech and behavior are normal.  ____________________________________________   LABS (all labs ordered are listed, but only abnormal results are displayed)  Labs Reviewed - No data to  display ____________________________________________  EKG   ____________________________________________  RADIOLOGY   ____________________________________________   PROCEDURES  Procedure(s) performed: None  Procedures  Critical Care performed: No  ____________________________________________   INITIAL IMPRESSION / ASSESSMENT AND PLAN / ED COURSE  Pertinent labs & imaging results that were available during my care of the patient were reviewed by me and considered in my medical decision making (see chart for details).  Dental abscess. Patient given discharge care instructions. Patient given a list of dental clinics for follow-up. Patient given a prescription for Percocet, ibuprofen and amoxicillin.  Clinical Course     ____________________________________________   FINAL CLINICAL IMPRESSION(S) / ED DIAGNOSES  Final diagnoses:  None      NEW MEDICATIONS STARTED DURING THIS VISIT:  New Prescriptions   AMOXICILLIN (AMOXIL) 500 MG CAPSULE    Take 1 capsule (500 mg total) by mouth 3 (three) times daily.   IBUPROFEN (ADVIL,MOTRIN) 600 MG TABLET    Take 1 tablet (600 mg total) by mouth every 8 (eight) hours as needed.   LIDOCAINE (XYLOCAINE) 2 % SOLUTION    Use as directed 5 mLs in the mouth or throat every 6 (six) hours as needed for mouth pain.  OXYCODONE-ACETAMINOPHEN (ROXICET) 5-325 MG TABLET    Take 1 tablet by mouth every 6 (six) hours as needed.     Note:  This document was prepared using Dragon voice recognition software and may include unintentional dictation errors.    Sable Feil, PA-C 04/21/16 [place] Quigley, MD 04/21/16 1115

## 2017-08-17 ENCOUNTER — Emergency Department
Admission: EM | Admit: 2017-08-17 | Discharge: 2017-08-17 | Disposition: A | Discharge status: Home / Self Care | Payer: Commercial Managed Care - PPO | Attending: Emergency Medicine | Admitting: Emergency Medicine

## 2017-08-17 ENCOUNTER — Encounter: Payer: Self-pay | Admitting: Emergency Medicine

## 2017-08-17 DIAGNOSIS — R220 Localized swelling, mass and lump, head: Secondary | ICD-10-CM | POA: Insufficient documentation

## 2017-08-17 DIAGNOSIS — Z79899 Other long term (current) drug therapy: Secondary | ICD-10-CM | POA: Insufficient documentation

## 2017-08-17 DIAGNOSIS — R221 Localized swelling, mass and lump, neck: Secondary | ICD-10-CM

## 2017-08-17 DIAGNOSIS — T7840XA Allergy, unspecified, initial encounter: Secondary | ICD-10-CM | POA: Insufficient documentation

## 2017-08-17 DIAGNOSIS — Z87891 Personal history of nicotine dependence: Secondary | ICD-10-CM | POA: Diagnosis not present

## 2017-08-17 DIAGNOSIS — I10 Essential (primary) hypertension: Secondary | ICD-10-CM | POA: Insufficient documentation

## 2017-08-17 MED ORDER — DIPHENHYDRAMINE HCL 50 MG/ML IJ SOLN: 50.0000 mg | Freq: Once | INTRAMUSCULAR | Status: DC

## 2017-08-17 MED ORDER — METHYLPREDNISOLONE SODIUM SUCC 125 MG IJ SOLR
125.0000 mg | Freq: Once | INTRAMUSCULAR | Status: DC
Start: 2017-08-17 — End: 2017-08-17

## 2017-08-17 MED ORDER — FAMOTIDINE 20 MG PO TABS
40.0000 mg | ORAL_TABLET | Freq: Once | ORAL | Status: AC
  Administered 2017-08-17: 40 mg via ORAL
  Filled 2017-08-17: qty 2

## 2017-08-17 MED ORDER — FAMOTIDINE 40 MG PO TABS: 40.0000 mg | ORAL_TABLET | Freq: Every evening | ORAL | 0 refills | Status: DC

## 2017-08-17 MED ORDER — PREDNISONE 20 MG PO TABS: 60.0000 mg | ORAL_TABLET | Freq: Every day | ORAL | 0 refills | Status: DC

## 2017-08-17 MED ORDER — FAMOTIDINE IN NACL 20-0.9 MG/50ML-% IV SOLN: 20.0000 mg | Freq: Once | INTRAVENOUS | Status: DC

## 2017-08-17 MED ORDER — PREDNISONE 20 MG PO TABS
60.0000 mg | ORAL_TABLET | Freq: Once | ORAL | Status: DC
  Filled 2017-08-17: qty 3

## 2017-08-17 NOTE — ED Triage Notes (Signed)
Pt reports she woke with sudden onset of throat swelling and the feeling of choking. Pt sts she had a fudge strip cookie before bed but has had them before. Pt denies SOB but sts she feels her throat is closing. Pt took 25mg  benadryl before arriving to ED. Pt denies sore throat as well as recent cough/congestion. Pts throat is swollen on both sides but no tenderness on neck. Tongue is not swollen.

## 2017-08-17 NOTE — Discharge Instructions (Signed)
Please follow up with your primary care physician for further evaluation of your throat swelling

## 2017-08-17 NOTE — ED Provider Notes (Signed)
[facility] Emergency Department Provider Note   ____________________________________________   First MD Initiated Contact with Patient 08/17/17 [phone]     (approximate)  I have reviewed the triage vital signs and the nursing notes.   HISTORY  Chief Complaint Oral Swelling    HPI Melanie Briggs is a 46 y.o. female who comes into the hospital today with some throat swelling.  She reports that she was sleeping and woke up and felt like she was going to choke.  The patient's girlfriend looked in her throat and states that her tonsils appeared swollen.  She states that she took a Benadryl at home and she feels better currently.  The patient felt at the time that she could not get her breath but she feels better now.  She states that before going to bed she had had some cookies.  She is allergic to coconut but has never had a reaction like this before.  She is here today for evaluation.   Past Medical History:  Diagnosis Date  . Bronchitis   . Hypertension   . Migraine     There are no active problems to display for this patient.   Past Surgical History:  Procedure Laterality Date  . ABDOMINAL HYSTERECTOMY      Prior to Admission medications   Medication Sig Start Date End Date Taking? Authorizing Provider  acetaminophen (TYLENOL) 500 MG tablet Take 1,000 mg by mouth daily as needed for headache.     [provider]  amoxicillin (AMOXIL) 500 MG capsule Take 1 capsule (500 mg total) by mouth 3 (three) times daily. 04/21/16   Sable Feil, PA-C  amoxicillin-clavulanate (AUGMENTIN) 875-125 MG per tablet Take 1 tablet by mouth every 12 (twelve) hours. Take with food 01/24/15   Lily Kocher, PA-C  dexamethasone (DECADRON) 4 MG tablet Take 1 tablet (4 mg total) by mouth 2 (two) times daily with a meal. 01/24/15   Lily Kocher, PA-C  diphenhydrAMINE (BENADRYL) 25 MG tablet Take 25 mg by mouth every 6 (six) hours as needed (allergic reaction).     [provider]  famotidine (PEPCID) 40 MG tablet Take 1 tablet (40 mg total) by mouth every evening. 08/17/17 08/17/18  Loney Hering, MD  HYDROcodone-acetaminophen (NORCO/VICODIN) 5-325 MG per tablet Take 1 tablet by mouth every 4 (four) hours as needed. 01/24/15   Lily Kocher, PA-C  ibuprofen (ADVIL,MOTRIN) 600 MG tablet Take 1 tablet (600 mg total) by mouth every 8 (eight) hours as needed. 04/21/16   Sable Feil, PA-C  lidocaine (XYLOCAINE) 2 % solution Use as directed 5 mLs in the mouth or throat every 6 (six) hours as needed for mouth pain. 04/21/16   Sable Feil, PA-C  loratadine-pseudoephedrine (CLARITIN-D 12 HOUR) 5-120 MG per tablet Take 1 tablet by mouth 2 (two) times daily. For congestion. 01/24/15   Lily Kocher, PA-C  naproxen sodium (ANAPROX) 220 MG tablet Take 220 mg by mouth daily as needed (pain).    [provider]  Phenylephrine-DM-GG-APAP 5-10-200-325 MG CAPS Take 2 tablets by mouth daily as needed (allergies).    [provider]  predniSONE (DELTASONE) 20 MG tablet Take 3 tablets (60 mg total) by mouth daily. 08/17/17   Loney Hering, MD  PredniSONE 10 MG KIT Take 6 tablets PO today then 5, 4, 3, 2, 1 Patient not taking: Reported on 01/24/2015 02/28/14   Ashley Murrain, NP  pseudoephedrine (SUDAFED) 30 MG tablet Take 30 mg by mouth every 4 (  four) hours as needed for congestion.    [provider]  Tetrahydrozoline HCl (VISINE OP) Apply 2 drops to eye daily as needed (burning/itching).    [provider]    Allergies Lisinopril and Naproxen  Family History  Problem Relation Age of Onset  . Diabetes Other     Social History Social History   Tobacco Use  . Smoking status: Former Smoker    Years: 3.00    Types: Cigarettes  . Smokeless tobacco: Never Used  Substance Use Topics  . Alcohol use: Yes    Comment: socailly  . Drug use: No    Review of Systems  Constitutional: No fever/chills Eyes: No visual  changes. ENT: throat swelling Cardiovascular: Denies chest pain. Respiratory: shortness of breath. Gastrointestinal: No abdominal pain.  No nausea, no vomiting.  No diarrhea.  No constipation. Genitourinary: Negative for dysuria. Musculoskeletal: Negative for back pain. Skin: Negative for rash. Neurological: Negative for headaches, focal weakness or numbness.   ____________________________________________   PHYSICAL EXAM:  VITAL SIGNS: ED Triage Vitals  Enc Vitals Group     BP 08/17/17 0211 96/67     Pulse Rate 08/17/17 0209 78     Resp 08/17/17 0209 17     Temp 08/17/17 0209 97.8 F (36.6 C)     Temp Source 08/17/17 0209 Oral     SpO2 08/17/17 0209 98 %     Weight 08/17/17 0210 210 lb (95.3 kg)     Height --      Head Circumference --      Peak Flow --      Pain Score --      Pain Loc --      Pain Edu? --      Excl. in [place]? --     Constitutional: Alert and oriented. Well appearing and in no acute distress. Eyes: Conjunctivae are normal. PERRL. EOMI. Head: Atraumatic. Nose: No congestion/rhinnorhea. Mouth/Throat: Mucous membranes are moist.  Oropharynx non-erythematous. Cardiovascular: Normal rate, regular rhythm. Grossly normal heart sounds.  Good peripheral circulation. Respiratory: Normal respiratory effort.  No retractions. Lungs CTAB. Gastrointestinal: Soft and nontender. No distention. Positive bowel sounds Musculoskeletal: No lower extremity tenderness nor edema.   Neurologic:  Normal speech and language.  Skin:  Skin is warm, dry and intact.  Psychiatric: Mood and affect are normal.   ____________________________________________   LABS (all labs ordered are listed, but only abnormal results are displayed)  Labs Reviewed - No data to display ____________________________________________  EKG  none ____________________________________________  RADIOLOGY  No results  found.  ____________________________________________   PROCEDURES  Procedure(s) performed: None  Procedures  Critical Care performed: No  ____________________________________________   INITIAL IMPRESSION / ASSESSMENT AND PLAN / ED COURSE  As part of my medical decision making, I reviewed the following data within the electronic MEDICAL RECORD NUMBER Notes from prior ED visits and [place] Controlled Substance Database   This is a 46 year old female who comes into the hospital today with some throat swelling.  She took some Benadryl and she states that it is improved.  The patient is not having any wheezing or difficulty breathing.  I will give the patient some prednisone and Pepcid.  It appears that the patient may have had an allergic reaction.  She will be discharged home to follow-up with her primary care physician.      ____________________________________________   FINAL CLINICAL IMPRESSION(S) / ED DIAGNOSES  Final diagnoses:  Throat swelling  Allergic reaction, initial encounter  ED Discharge Orders        Ordered    predniSONE (DELTASONE) 20 MG tablet  Daily     08/17/17 0403    famotidine (PEPCID) 40 MG tablet  Every evening     08/17/17 0403       Note:  This document was prepared using Dragon voice recognition software and may include unintentional dictation errors.    Loney Hering, MD 08/17/17 [phone]

## 2017-08-17 NOTE — ED Notes (Signed)
Pt to the er for a choking feeling that woke her up. Pt says that her girlfriend looked in her throat and saw swelling. No SOB, sore throat, fever, or cough. Tonsils are visible no pain to the neck. Lymph nodes are not swollen. No pain with palpation. Pt took a benadryl prior to leaving. No distress noted.

## 2017-11-24 DIAGNOSIS — G8929 Other chronic pain: Secondary | ICD-10-CM | POA: Insufficient documentation

## 2017-11-24 DIAGNOSIS — M542 Cervicalgia: Secondary | ICD-10-CM | POA: Insufficient documentation

## 2019-07-23 ENCOUNTER — Other Ambulatory Visit: Payer: Self-pay

## 2019-07-23 DIAGNOSIS — Z20822 Contact with and (suspected) exposure to covid-19: Secondary | ICD-10-CM

## 2019-07-24 LAB — NOVEL CORONAVIRUS, NAA

## 2019-11-19 ENCOUNTER — Telehealth: Payer: Self-pay

## 2019-11-19 NOTE — Telephone Encounter (Signed)
Confirmed and screened for in office appt 11/21/19. Melanie Briggs

## 2019-11-21 ENCOUNTER — Encounter: Payer: Self-pay | Admitting: Adult Health

## 2019-11-21 ENCOUNTER — Ambulatory Visit: Payer: 59 | Admitting: Adult Health

## 2019-11-21 ENCOUNTER — Other Ambulatory Visit: Payer: Self-pay

## 2019-11-21 VITALS — BP 118/88 | HR 87 | Temp 97.5°F | Resp 16 | Ht 66.0 in | Wt 189.6 lb

## 2019-11-21 DIAGNOSIS — F321 Major depressive disorder, single episode, moderate: Secondary | ICD-10-CM | POA: Diagnosis not present

## 2019-11-21 DIAGNOSIS — M62838 Other muscle spasm: Secondary | ICD-10-CM | POA: Diagnosis not present

## 2019-11-21 DIAGNOSIS — Z6830 Body mass index (BMI) 30.0-30.9, adult: Secondary | ICD-10-CM | POA: Diagnosis not present

## 2019-11-21 DIAGNOSIS — I10 Essential (primary) hypertension: Secondary | ICD-10-CM | POA: Diagnosis not present

## 2019-11-21 MED ORDER — ESCITALOPRAM OXALATE 20 MG PO TABS: 20.0000 mg | ORAL_TABLET | Freq: Every day | ORAL | 1 refills | Status: DC

## 2019-11-21 MED ORDER — HYDROCHLOROTHIAZIDE 25 MG PO TABS: 25.0000 mg | ORAL_TABLET | Freq: Every day | ORAL | 1 refills | Status: DC

## 2019-11-21 MED ORDER — AMLODIPINE BESYLATE 5 MG PO TABS: 5.0000 mg | ORAL_TABLET | Freq: Every day | ORAL | 0 refills | Status: DC

## 2019-11-21 MED ORDER — CYCLOBENZAPRINE HCL 10 MG PO TABS: 10.0000 mg | ORAL_TABLET | Freq: Three times a day (TID) | ORAL | 0 refills | Status: DC | PRN

## 2019-11-21 NOTE — Progress Notes (Signed)
[facility] [place], [place] [address]  Internal MEDICINE  Office Visit Note  Patient Name: Melanie Briggs  [id number]  [id number]  Date of Service: 11/21/2019   Complaints/HPI Pt is here for establishment of PCP. No chief complaint on file.  HPI Pt is here to establish care.  She is a well appearing 49 yo AA female.  She is single, working for [employer] at this time. She reports a medical history that includes HTN and Migraine headaches. She has seen neurology in the past, and tension headaches. Her bp is initially elevated on arrival. Her mother unexpectedly passed away ealier this year, and she has been having a hard time with this.  She is the oldest of 6 siblings, and she has been having to get everyone through this.  She feels depressed and anxious about her mom being gone.   Current Medication: Outpatient Encounter Medications as of 11/21/2019  Medication Sig  . acetaminophen (TYLENOL) 500 MG tablet Take 1,000 mg by mouth daily as needed for headache.   . ibuprofen (ADVIL,MOTRIN) 600 MG tablet Take 1 tablet (600 mg total) by mouth every 8 (eight) hours as needed.  . lidocaine (XYLOCAINE) 2 % solution Use as directed 5 mLs in the mouth or throat every 6 (six) hours as needed for mouth pain.  Marland Kitchen loratadine-pseudoephedrine (CLARITIN-D 12 HOUR) 5-120 MG per tablet Take 1 tablet by mouth 2 (two) times daily. For congestion.  . pseudoephedrine (SUDAFED) 30 MG tablet Take 30 mg by mouth every 4 (four) hours as needed for congestion.  . Tetrahydrozoline HCl (VISINE OP) Apply 2 drops to eye daily as needed (burning/itching).  Marland Kitchen amoxicillin (AMOXIL) 500 MG capsule Take 1 capsule (500 mg total) by mouth 3 (three) times daily. (Patient not taking: Reported on 11/21/2019)  . amoxicillin-clavulanate (AUGMENTIN) 875-125 MG per tablet Take 1 tablet by mouth every 12 (twelve) hours. Take with food (Patient not taking: Reported on 11/21/2019)  . dexamethasone (DECADRON) 4 MG  tablet Take 1 tablet (4 mg total) by mouth 2 (two) times daily with a meal. (Patient not taking: Reported on 11/21/2019)  . diphenhydrAMINE (BENADRYL) 25 MG tablet Take 25 mg by mouth every 6 (six) hours as needed (allergic reaction).  . famotidine (PEPCID) 40 MG tablet Take 1 tablet (40 mg total) by mouth every evening.  Marland Kitchen HYDROcodone-acetaminophen (NORCO/VICODIN) 5-325 MG per tablet Take 1 tablet by mouth every 4 (four) hours as needed. (Patient not taking: Reported on 11/21/2019)  . naproxen sodium (ANAPROX) 220 MG tablet Take 220 mg by mouth daily as needed (pain).  . Phenylephrine-DM-GG-APAP 5-10-200-325 MG CAPS Take 2 tablets by mouth daily as needed (allergies).  . predniSONE (DELTASONE) 20 MG tablet Take 3 tablets (60 mg total) by mouth daily. (Patient not taking: Reported on 11/21/2019)  . PredniSONE 10 MG KIT Take 6 tablets PO today then 5, 4, 3, 2, 1 (Patient not taking: Reported on 11/21/2019)   No facility-administered encounter medications on file as of 11/21/2019.    Surgical History: Past Surgical History:  Procedure Laterality Date  . ABDOMINAL HYSTERECTOMY    . TUMOR EXCISION N/A     Medical History: Past Medical History:  Diagnosis Date  . Bronchitis   . Hypertension   . Migraine     Family History: Family History  Problem Relation Age of Onset  . Diabetes Other   . Diabetes Mother   . Kidney failure Mother   . Pancreatitis Mother   . Hyperlipidemia Father  Social History   Socioeconomic History  . Marital status: Single    Spouse name: Not on file  . Number of children: Not on file  . Years of education: Not on file  . Highest education level: Not on file  Occupational History  . Not on file  Tobacco Use  . Smoking status: Former Smoker    Years: 3.00    Types: Cigarettes  . Smokeless tobacco: Never Used  Substance and Sexual Activity  . Alcohol use: Yes    Comment: socailly  . Drug use: No  . Sexual activity: Not on file  Other Topics  Concern  . Not on file  Social History Narrative  . Not on file   Social Determinants of Health   Financial Resource Strain:   . Difficulty of Paying Living Expenses: Not on file  Food Insecurity:   . Worried About Charity fundraiser in the Last Year: Not on file  . Ran Out of Food in the Last Year: Not on file  Transportation Needs:   . Lack of Transportation (Medical): Not on file  . Lack of Transportation (Non-Medical): Not on file  Physical Activity:   . Days of Exercise per Week: Not on file  . Minutes of Exercise per Session: Not on file  Stress:   . Feeling of Stress : Not on file  Social Connections:   . Frequency of Communication with Friends and Family: Not on file  . Frequency of Social Gatherings with Friends and Family: Not on file  . Attends Religious Services: Not on file  . Active Member of Clubs or Organizations: Not on file  . Attends Archivist Meetings: Not on file  . Marital Status: Not on file  Intimate Partner Violence:   . Fear of Current or Ex-Partner: Not on file  . Emotionally Abused: Not on file  . Physically Abused: Not on file  . Sexually Abused: Not on file     Review of Systems  Constitutional: Negative for chills, fatigue and unexpected weight change.  HENT: Negative for congestion, rhinorrhea, sneezing and sore throat.   Eyes: Negative for photophobia, pain and redness.  Respiratory: Negative for cough, chest tightness and shortness of breath.   Cardiovascular: Negative for chest pain and palpitations.  Gastrointestinal: Negative for abdominal pain, constipation, diarrhea, nausea and vomiting.  Endocrine: Negative.   Genitourinary: Negative for dysuria and frequency.  Musculoskeletal: Negative for arthralgias, back pain, joint swelling and neck pain.  Skin: Negative for rash.  Allergic/Immunologic: Negative.   Neurological: Negative for tremors and numbness.  Hematological: Negative for adenopathy. Does not bruise/bleed  easily.  Psychiatric/Behavioral: Negative for behavioral problems and sleep disturbance. The patient is not nervous/anxious.     Vital Signs: BP (!) 148/97   Pulse 87   Temp (!) 97.5 F (36.4 C)   Resp 16   Ht _0  (1.676 m)   Wt 189 lb 9.6 oz (86 kg)   SpO2 99%   BMI 30.60 kg/m    Physical Exam Vitals and nursing note reviewed.  Constitutional:      General: She is not in acute distress.    Appearance: She is well-developed. She is not diaphoretic.  HENT:     Head: Normocephalic and atraumatic.     Mouth/Throat:     Pharynx: No oropharyngeal exudate.  Eyes:     Pupils: Pupils are equal, round, and reactive to light.  Neck:     Thyroid: No thyromegaly.  Vascular: No JVD.     Trachea: No tracheal deviation.  Cardiovascular:     Rate and Rhythm: Normal rate and regular rhythm.     Heart sounds: Normal heart sounds. No murmur. No friction rub. No gallop.   Pulmonary:     Effort: Pulmonary effort is normal. No respiratory distress.     Breath sounds: Normal breath sounds. No wheezing or rales.  Chest:     Chest wall: No tenderness.  Abdominal:     Palpations: Abdomen is soft.     Tenderness: There is no abdominal tenderness. There is no guarding.  Musculoskeletal:        General: Normal range of motion.     Cervical back: Normal range of motion and neck supple.  Lymphadenopathy:     Cervical: No cervical adenopathy.  Skin:    General: Skin is warm and dry.  Neurological:     Mental Status: She is alert and oriented to person, place, and time.     Cranial Nerves: No cranial nerve deficit.  Psychiatric:        Behavior: Behavior normal.        Thought Content: Thought content normal.        Judgment: Judgment normal.    Assessment/Plan: 1. Essential hypertension Stable, continue Norvasc and HCTZ as prescribed.  Refill sent for patient. BP at recheck 118/88 - amLODipine (NORVASC) 5 MG tablet; Take 1 tablet (5 mg total) by mouth daily.  Dispense: 30 tablet;  Refill: 0 - hydrochlorothiazide (HYDRODIURIL) 25 MG tablet; Take 1 tablet (25 mg total) by mouth daily.  Dispense: 90 tablet; Refill: 1  2. Depression, major, single episode, moderate (HCC) Start Lexapro, will follow up in 4 weeks.  - escitalopram (LEXAPRO) 20 MG tablet; Take 1 tablet (20 mg total) by mouth daily.  Dispense: 30 tablet; Refill: 1  3. Muscle spasm Refilled patients flexeril for tension and muscle spasm of neck/trapezus muscles. Suspect this is from stress and tension, hopefully lexapro will help with this also.  - cyclobenzaprine (FLEXERIL) 10 MG tablet; Take 1 tablet (10 mg total) by mouth 3 (three) times daily as needed for muscle spasms.  Dispense: 30 tablet; Refill: 0   General Counseling: Yovanna verbalizes understanding of the findings of todays visit and agrees with plan of treatment. I have discussed any further diagnostic evaluation that may be needed or ordered today. We also reviewed her medications today. she has been encouraged to call the office with any questions or concerns that should arise related to todays visit.  No orders of the defined types were placed in this encounter.   No orders of the defined types were placed in this encounter.   Time spent: 40 Minutes   This patient was seen by Orson Gear AGNP-C in Collaboration with Dr Lavera Guise as a part of collaborative care agreement  Kendell Bane AGNP-C Internal Medicine

## 2019-11-29 ENCOUNTER — Telehealth: Payer: Self-pay

## 2019-11-29 NOTE — Telephone Encounter (Signed)
Left a message and asked pt to call and schedule an appointment to discuss and complete FMLA paperwork that she dropped off, per provider she will need an appointment. Beth

## 2019-11-30 ENCOUNTER — Telehealth: Payer: Self-pay

## 2019-11-30 NOTE — Telephone Encounter (Signed)
Left a message and advised patient FMLA paperwork ready for pick up and put copy in scan. Beth

## 2019-12-17 ENCOUNTER — Telehealth: Payer: Self-pay

## 2019-12-17 ENCOUNTER — Other Ambulatory Visit: Payer: Self-pay

## 2019-12-17 DIAGNOSIS — F321 Major depressive disorder, single episode, moderate: Secondary | ICD-10-CM

## 2019-12-17 MED ORDER — ESCITALOPRAM OXALATE 20 MG PO TABS: 20.0000 mg | ORAL_TABLET | Freq: Every day | ORAL | 0 refills | Status: DC

## 2019-12-17 NOTE — Telephone Encounter (Signed)
LMOM TO CONFIRM AND SCREEN FOR 12-19-19 OV. 

## 2019-12-19 ENCOUNTER — Ambulatory Visit: Payer: 59 | Admitting: Adult Health

## 2019-12-31 ENCOUNTER — Telehealth: Payer: Self-pay

## 2019-12-31 NOTE — Telephone Encounter (Signed)
Tried contacting patient to inform of appointment on 01/02/2020 unable to confirm no voicemail. klh

## 2020-01-02 ENCOUNTER — Ambulatory Visit (INDEPENDENT_AMBULATORY_CARE_PROVIDER_SITE_OTHER): Payer: 59 | Admitting: Adult Health

## 2020-01-02 VITALS — Ht 64.0 in | Wt 189.0 lb

## 2020-01-02 DIAGNOSIS — I10 Essential (primary) hypertension: Secondary | ICD-10-CM | POA: Diagnosis not present

## 2020-01-02 DIAGNOSIS — J0100 Acute maxillary sinusitis, unspecified: Secondary | ICD-10-CM | POA: Diagnosis not present

## 2020-01-02 MED ORDER — AZITHROMYCIN 250 MG PO TABS: ORAL_TABLET | ORAL | 0 refills | Status: DC

## 2020-01-02 NOTE — Progress Notes (Signed)
[facility] [place], [place] [address]  Internal MEDICINE  Telephone Visit  Patient Name: Melanie Briggs  [id number]  [id number]  Date of Service: 01/02/2020  I connected with the patient at 1218 by video and verified the patients identity using two identifiers.   I discussed the limitations, risks, security and privacy concerns of performing an evaluation and management service by telephone and the availability of in person appointments. I also discussed with the patient that there may be a patient responsible charge related to the service.  The patient expressed understanding and agrees to proceed.    Chief Complaint  Patient presents with  . Telephone Assessment  . Telephone Screen  . Hypertension    HPI  PT seen via video.  She reports 5 days ago she started feeling bad.  She described head congestion.  She initially thought it was allergies however the symptoms did decline with allegra.  The she noticed a cough, PND and weakness.  She was covid tested today but does  Not have results.  She does report her sense of taste has gone away.      Current Medication: Outpatient Encounter Medications as of 01/02/2020  Medication Sig  . acetaminophen (TYLENOL) 500 MG tablet Take 1,000 mg by mouth daily as needed for headache.   Marland Kitchen amLODipine (NORVASC) 5 MG tablet Take 1 tablet (5 mg total) by mouth daily.  . cyclobenzaprine (FLEXERIL) 10 MG tablet Take 1 tablet (10 mg total) by mouth 3 (three) times daily as needed for muscle spasms.  . diphenhydrAMINE (BENADRYL) 25 MG tablet Take 25 mg by mouth every 6 (six) hours as needed (allergic reaction).  Marland Kitchen escitalopram (LEXAPRO) 20 MG tablet Take 1 tablet (20 mg total) by mouth daily.  . hydrochlorothiazide (HYDRODIURIL) 25 MG tablet Take 1 tablet (25 mg total) by mouth daily.  Marland Kitchen ibuprofen (ADVIL,MOTRIN) 600 MG tablet Take 1 tablet (600 mg total) by mouth every 8 (eight) hours as needed.  . lidocaine (XYLOCAINE) 2 %  solution Use as directed 5 mLs in the mouth or throat every 6 (six) hours as needed for mouth pain.  Marland Kitchen loratadine-pseudoephedrine (CLARITIN-D 12 HOUR) 5-120 MG per tablet Take 1 tablet by mouth 2 (two) times daily. For congestion.  . naproxen sodium (ANAPROX) 220 MG tablet Take 220 mg by mouth daily as needed (pain).  . Phenylephrine-DM-GG-APAP 5-10-200-325 MG CAPS Take 2 tablets by mouth daily as needed (allergies).  . pseudoephedrine (SUDAFED) 30 MG tablet Take 30 mg by mouth every 4 (four) hours as needed for congestion.  . Tetrahydrozoline HCl (VISINE OP) Apply 2 drops to eye daily as needed (burning/itching).  . famotidine (PEPCID) 40 MG tablet Take 1 tablet (40 mg total) by mouth every evening.  . [DISCONTINUED] amoxicillin (AMOXIL) 500 MG capsule Take 1 capsule (500 mg total) by mouth 3 (three) times daily. (Patient not taking: Reported on 11/21/2019)  . [DISCONTINUED] amoxicillin-clavulanate (AUGMENTIN) 875-125 MG per tablet Take 1 tablet by mouth every 12 (twelve) hours. Take with food (Patient not taking: Reported on 11/21/2019)  . [DISCONTINUED] dexamethasone (DECADRON) 4 MG tablet Take 1 tablet (4 mg total) by mouth 2 (two) times daily with a meal. (Patient not taking: Reported on 11/21/2019)  . [DISCONTINUED] HYDROcodone-acetaminophen (NORCO/VICODIN) 5-325 MG per tablet Take 1 tablet by mouth every 4 (four) hours as needed. (Patient not taking: Reported on 11/21/2019)  . [DISCONTINUED] predniSONE (DELTASONE) 20 MG tablet Take 3 tablets (60 mg total) by mouth daily. (Patient not taking: Reported  on 11/21/2019)  . [DISCONTINUED] PredniSONE 10 MG KIT Take 6 tablets PO today then 5, 4, 3, 2, 1 (Patient not taking: Reported on 11/21/2019)   No facility-administered encounter medications on file as of 01/02/2020.    Surgical History: Past Surgical History:  Procedure Laterality Date  . ABDOMINAL HYSTERECTOMY    . TUMOR EXCISION N/A     Medical History: Past Medical History:  Diagnosis Date   . Bronchitis   . Hypertension   . Migraine     Family History: Family History  Problem Relation Age of Onset  . Diabetes Other   . Diabetes Mother   . Kidney failure Mother   . Pancreatitis Mother   . Hyperlipidemia Father     Social History   Socioeconomic History  . Marital status: Single    Spouse name: Not on file  . Number of children: Not on file  . Years of education: Not on file  . Highest education level: Not on file  Occupational History  . Not on file  Tobacco Use  . Smoking status: Former Smoker    Years: 3.00    Types: Cigarettes  . Smokeless tobacco: Never Used  Substance and Sexual Activity  . Alcohol use: Yes    Comment: socailly  . Drug use: No  . Sexual activity: Not on file  Other Topics Concern  . Not on file  Social History Narrative  . Not on file   Social Determinants of Health   Financial Resource Strain:   . Difficulty of Paying Living Expenses:   Food Insecurity:   . Worried About Charity fundraiser in the Last Year:   . Arboriculturist in the Last Year:   Transportation Needs:   . Film/video editor (Medical):   Marland Kitchen Lack of Transportation (Non-Medical):   Physical Activity:   . Days of Exercise per Week:   . Minutes of Exercise per Session:   Stress:   . Feeling of Stress :   Social Connections:   . Frequency of Communication with Friends and Family:   . Frequency of Social Gatherings with Friends and Family:   . Attends Religious Services:   . Active Member of Clubs or Organizations:   . Attends Archivist Meetings:   Marland Kitchen Marital Status:   Intimate Partner Violence:   . Fear of Current or Ex-Partner:   . Emotionally Abused:   Marland Kitchen Physically Abused:   . Sexually Abused:       Review of Systems  Constitutional: Negative for chills, fatigue and unexpected weight change.  HENT: Negative for congestion, rhinorrhea, sneezing and sore throat.   Eyes: Negative for photophobia, pain and redness.  Respiratory:  Positive for cough. Negative for chest tightness and shortness of breath.   Cardiovascular: Negative for chest pain and palpitations.  Gastrointestinal: Negative for abdominal pain, constipation, diarrhea, nausea and vomiting.  Endocrine: Negative.   Genitourinary: Negative for dysuria and frequency.  Musculoskeletal: Negative for arthralgias, back pain, joint swelling and neck pain.  Skin: Negative for rash.  Allergic/Immunologic: Negative.   Neurological: Negative for tremors and numbness.  Hematological: Negative for adenopathy. Does not bruise/bleed easily.  Psychiatric/Behavioral: Negative for behavioral problems and sleep disturbance. The patient is not nervous/anxious.     Vital Signs: Ht 5' 4"  (1.626 m)   Wt 189 lb (85.7 kg)   BMI 32.44 kg/m    Observation/Objective:  Well sounding, hoarse voice. NAD noted   Assessment/Plan: 1. Acute non-recurrent maxillary sinusitis  Advised patient to take entire course of antibiotics as prescribed with food. Pt should return to clinic in 7-10 days if symptoms fail to improve or new symptoms develop. She will let us know status of covid test. - azithromycin (ZITHROMAX) 250 MG tablet; Take as directed.  Dispense: 6 tablet; Refill: 0  2. Essential hypertension Continue present meds, and continue to monitor pressure.   General Counseling: Trevia verbalizes understanding of the findings of today's phone visit and agrees with plan of treatment. I have discussed any further diagnostic evaluation that may be needed or ordered today. We also reviewed her medications today. she has been encouraged to call the office with any questions or concerns that should arise related to todays visit.    No orders of the defined types were placed in this encounter.   No orders of the defined types were placed in this encounter.   Time spent: [place] [facility] Internal medicine

## 2020-01-04 ENCOUNTER — Telehealth: Payer: Self-pay

## 2020-01-04 NOTE — Telephone Encounter (Signed)
Pt called that she is positive for covid as per adam advised take zpak and if coughing or Sob call us back or if worse she need go to ED and also advised she can rest and  Drink plenty of water

## 2020-07-11 DIAGNOSIS — F32A Depression, unspecified: Secondary | ICD-10-CM | POA: Insufficient documentation

## 2020-07-22 ENCOUNTER — Other Ambulatory Visit: Payer: Self-pay

## 2020-07-22 ENCOUNTER — Encounter: Payer: Self-pay | Admitting: Hospice and Palliative Medicine

## 2020-07-22 ENCOUNTER — Ambulatory Visit: Payer: No Typology Code available for payment source | Admitting: Hospice and Palliative Medicine

## 2020-07-22 DIAGNOSIS — G479 Sleep disorder, unspecified: Secondary | ICD-10-CM

## 2020-07-22 DIAGNOSIS — I10 Essential (primary) hypertension: Secondary | ICD-10-CM | POA: Diagnosis not present

## 2020-07-22 DIAGNOSIS — F331 Major depressive disorder, recurrent, moderate: Secondary | ICD-10-CM

## 2020-07-22 MED ORDER — MIRTAZAPINE 7.5 MG PO TABS: 7.5000 mg | ORAL_TABLET | Freq: Every day | ORAL | 0 refills | Status: DC

## 2020-07-22 NOTE — Progress Notes (Signed)
[facility] [address] [place], [place] [address]  Internal MEDICINE  Office Visit Note  Patient Name: Melanie Briggs  [id number]  [id number]  Date of Service: 07/23/2020  Chief Complaint  Patient presents with  . Follow-up    Fmla  . 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 acetaminophen (TYLENOL) 500 MG tablet Take 1,000 mg by mouth daily as needed for headache.   Marland Kitchen amLODipine (NORVASC) 5 MG tablet Take 1 tablet (5 mg total) by mouth daily.  . cyclobenzaprine (FLEXERIL) 10 MG tablet Take 1 tablet (10 mg total) by mouth 3 (three) times daily as needed for muscle spasms.  . diphenhydrAMINE (BENADRYL) 25 MG tablet Take 25 mg by mouth every 6 (six) hours as needed (allergic reaction).  Marland Kitchen escitalopram (LEXAPRO) 20 MG tablet Take 1 tablet (20 mg total) by mouth daily.  . hydrochlorothiazide (HYDRODIURIL) 25 MG tablet Take 1 tablet (25 mg total) by mouth daily.  Marland Kitchen ibuprofen (ADVIL,MOTRIN) 600 MG tablet Take 1 tablet (600 mg total) by mouth every 8 (eight) hours as needed.  . lidocaine (XYLOCAINE) 2 % solution Use as directed 5  mLs in the mouth or throat every 6 (six) hours as needed for mouth pain.  Marland Kitchen loratadine-pseudoephedrine (CLARITIN-D 12 HOUR) 5-120 MG per tablet Take 1 tablet by mouth 2 (two) times daily. For congestion.  . naproxen sodium (ANAPROX) 220 MG tablet Take 220 mg by mouth daily as needed (pain).  . Phenylephrine-DM-GG-APAP 5-10-200-325 MG CAPS Take 2 tablets by mouth daily as needed (allergies).  . pseudoephedrine (SUDAFED) 30 MG tablet Take 30 mg by mouth every 4 (four) hours as needed for congestion.  . Tetrahydrozoline HCl (VISINE OP) Apply 2 drops to eye daily as needed (burning/itching).  . famotidine (PEPCID) 40 MG tablet Take 1 tablet (40 mg total) by mouth every evening.  . mirtazapine (REMERON) 7.5 MG tablet Take 1 tablet (7.5 mg total) by mouth at bedtime.  . [DISCONTINUED] azithromycin (ZITHROMAX) 250 MG tablet Take as directed.   No facility-administered encounter medications on file as of 07/22/2020.    Surgical History: Past Surgical History:  Procedure Laterality Date  . ABDOMINAL HYSTERECTOMY    . TUMOR EXCISION N/A     Medical History: Past Medical History:  Diagnosis Date  . Bronchitis   . Hypertension   . Migraine     Family History: Family History  Problem Relation Age of Onset  . Diabetes Other   . Diabetes Mother   . Kidney failure Mother   . Pancreatitis Mother   . Hyperlipidemia Father     Social History   Socioeconomic History  . Marital status: Single  Spouse name: Not on file  . Number of children: Not on file  . Years of education: Not on file  . Highest education level: Not on file  Occupational History  . Not on file  Tobacco Use  . Smoking status: Former Smoker    Years: 3.00    Types: Cigarettes  . Smokeless tobacco: Never Used  Substance and Sexual Activity  . Alcohol use: Yes    Comment: socailly  . Drug use: No  . Sexual activity: Not on file  Other Topics Concern  . Not on file  Social History Narrative  . Not on file    Social Determinants of Health   Financial Resource Strain:   . Difficulty of Paying Living Expenses: Not on file  Food Insecurity:   . Worried About Programme researcher, broadcasting/film/video in the Last Year: Not on file  . Ran Out of Food in the Last Year: Not on file  Transportation Needs:   . Lack of Transportation (Medical): Not on file  . Lack of Transportation (Non-Medical): Not on file  Physical Activity:   . Days of Exercise per Week: Not on file  . Minutes of Exercise per Session: Not on file  Stress:   . Feeling of Stress : Not on file  Social Connections:   . Frequency of Communication with Friends and Family: Not on file  . Frequency of Social Gatherings with Friends and Family: Not on file  . Attends Religious Services: Not on file  . Active Member of Clubs or Organizations: Not on file  . Attends Banker Meetings: Not on file  . Marital Status: Not on file  Intimate Partner Violence:   . Fear of Current or Ex-Partner: Not on file  . Emotionally Abused: Not on file  . Physically Abused: Not on file  . Sexually Abused: Not on file      Review of Systems  Constitutional: Negative for chills, diaphoresis and fatigue.  HENT: Negative for ear pain, postnasal drip and sinus pressure.   Eyes: Negative for photophobia, discharge, redness, itching and visual disturbance.  Respiratory: Negative for cough, shortness of breath and wheezing.   Cardiovascular: Negative for chest pain, palpitations and leg swelling.  Gastrointestinal: Negative for abdominal pain, constipation, diarrhea, nausea and vomiting.  Genitourinary: Negative for dysuria and flank pain.  Musculoskeletal: Negative for arthralgias, back pain, gait problem and neck pain.  Skin: Negative for color change.  Allergic/Immunologic: Negative for environmental allergies and food allergies.  Neurological: Negative for dizziness and headaches.  Hematological: Does not bruise/bleed easily.  Psychiatric/Behavioral:  Positive for decreased concentration and sleep disturbance. Negative for agitation, behavioral problems (depression) and hallucinations.       Depression    Vital Signs: BP 128/90   Pulse 95   Temp 97.8 F (36.6 C)   Ht 5\' 6"  (1.676 m)   Wt 194 lb (88 kg)   SpO2 99%   BMI 31.31 kg/m    Physical Exam Vitals reviewed.  Constitutional:      Appearance: Normal appearance. She is obese.  Cardiovascular:     Rate and Rhythm: Normal rate and regular rhythm.     Pulses: Normal pulses.     Heart sounds: Normal heart sounds.  Pulmonary:     Effort: Pulmonary effort is normal.     Breath sounds: Normal breath sounds.  Musculoskeletal:     Cervical back: Normal range of motion.  Skin:    General: Skin is warm.  Neurological:  General: No focal deficit present.     Mental Status: She is alert and oriented to person, place, and time. Mental status is at baseline.  Psychiatric:        Mood and Affect: Mood normal.        Behavior: Behavior normal.        Thought Content: Thought content normal.    Assessment/Plan: 1. Moderate episode of recurrent major depressive disorder (HCC) Continue with lexapro as she feels this is controlling her symptoms at this time Continues to have intermittent episodes of sadness--these have improved since starting lexapro  2. Essential hypertension BP and HR well controlled at this time  3. Sleep disturbance Headaches likely secondary to sleep disturbance Will try low dose remeron to help with sleep If sleep continues to be an issue may need to consider sleep study for potential OSA - mirtazapine (REMERON) 7.5 MG tablet; Take 1 tablet (7.5 mg total) by mouth at bedtime.  Dispense: 60 tablet; Refill: 0  General Counseling: Akayla verbalizes understanding of the findings of todays visit and agrees with plan of treatment. I have discussed any further diagnostic evaluation that may be needed or ordered today. We also reviewed her medications today.  she has been encouraged to call the office with any questions or concerns that should arise related to todays visit.   Meds ordered this encounter  Medications  . mirtazapine (REMERON) 7.5 MG tablet    Sig: Take 1 tablet (7.5 mg total) by mouth at bedtime.    Dispense:  60 tablet    Refill:  0    Time spent: 30 Minutes Time spent includes review of chart, medications, test results and follow-up plan with the patient.  This patient was seen by Leeanne Deed AGNP-C in Collaboration with Dr Lyndon Code as a part of collaborative care agreement     Melanie Briggs. Melanie Briggs AGNP-C Internal medicine

## 2020-07-23 ENCOUNTER — Encounter: Payer: Self-pay | Admitting: Hospice and Palliative Medicine

## 2020-07-23 ENCOUNTER — Telehealth: Payer: Self-pay

## 2020-07-23 NOTE — Telephone Encounter (Signed)
Left message and advised pt her paperwork is ready for pick up. Doyal Saric

## 2020-07-24 NOTE — Progress Notes (Signed)
Scanned in updated fmla. Bartosz Luginbill

## 2020-07-28 ENCOUNTER — Encounter: Payer: Self-pay | Admitting: Hospice and Palliative Medicine

## 2020-07-30 ENCOUNTER — Encounter: Payer: Self-pay | Admitting: Hospice and Palliative Medicine

## 2020-08-04 ENCOUNTER — Ambulatory Visit: Payer: 59 | Admitting: Hospice and Palliative Medicine

## 2020-08-14 ENCOUNTER — Telehealth: Payer: Self-pay

## 2020-08-14 NOTE — Telephone Encounter (Signed)
Called patient and lmom on 08-04-20 and on 08-14-20 in regards to a medical record request received from [facility] family medicine. Patient has upcoming appointments scheduled want to confirm whether or not she is transferring care before records are mailed out.

## 2020-08-19 ENCOUNTER — Ambulatory Visit: Payer: No Typology Code available for payment source | Admitting: Hospice and Palliative Medicine

## 2020-09-01 ENCOUNTER — Encounter: Payer: No Typology Code available for payment source | Admitting: Hospice and Palliative Medicine

## 2020-09-03 ENCOUNTER — Telehealth: Payer: Self-pay

## 2020-09-03 NOTE — Telephone Encounter (Signed)
Lmom to reschedule missed appt frm 09-01-20. I have tried to reach out to patient several times to see if they are seeking care elsewhere. [name]

## 2020-09-18 DIAGNOSIS — Z20822 Contact with and (suspected) exposure to covid-19: Secondary | ICD-10-CM | POA: Diagnosis not present

## 2020-09-18 DIAGNOSIS — Z03818 Encounter for observation for suspected exposure to other biological agents ruled out: Secondary | ICD-10-CM | POA: Diagnosis not present

## 2020-09-24 ENCOUNTER — Other Ambulatory Visit: Payer: Self-pay

## 2020-09-24 ENCOUNTER — Telehealth: Payer: Self-pay

## 2020-09-24 ENCOUNTER — Ambulatory Visit (INDEPENDENT_AMBULATORY_CARE_PROVIDER_SITE_OTHER): Payer: BC Managed Care – PPO | Admitting: Hospice and Palliative Medicine

## 2020-09-24 ENCOUNTER — Encounter: Payer: Self-pay | Admitting: Hospice and Palliative Medicine

## 2020-09-24 VITALS — BP 136/90 | HR 69 | Temp 97.5°F | Resp 16 | Ht 66.0 in | Wt 195.8 lb

## 2020-09-24 DIAGNOSIS — G8929 Other chronic pain: Secondary | ICD-10-CM

## 2020-09-24 DIAGNOSIS — I10 Essential (primary) hypertension: Secondary | ICD-10-CM

## 2020-09-24 DIAGNOSIS — R519 Headache, unspecified: Secondary | ICD-10-CM

## 2020-09-24 DIAGNOSIS — Z1231 Encounter for screening mammogram for malignant neoplasm of breast: Secondary | ICD-10-CM

## 2020-09-24 DIAGNOSIS — R0600 Dyspnea, unspecified: Secondary | ICD-10-CM

## 2020-09-24 DIAGNOSIS — R300 Dysuria: Secondary | ICD-10-CM

## 2020-09-24 DIAGNOSIS — R0609 Other forms of dyspnea: Secondary | ICD-10-CM

## 2020-09-24 DIAGNOSIS — Z0001 Encounter for general adult medical examination with abnormal findings: Secondary | ICD-10-CM | POA: Diagnosis not present

## 2020-09-24 DIAGNOSIS — F321 Major depressive disorder, single episode, moderate: Secondary | ICD-10-CM

## 2020-09-24 DIAGNOSIS — M542 Cervicalgia: Secondary | ICD-10-CM

## 2020-09-24 MED ORDER — METOPROLOL SUCCINATE ER 50 MG PO TB24: 100.0000 mg | ORAL_TABLET | Freq: Every day | ORAL | 0 refills | Status: DC

## 2020-09-24 MED ORDER — ALBUTEROL SULFATE HFA 108 (90 BASE) MCG/ACT IN AERS: 2.0000 | INHALATION_SPRAY | Freq: Four times a day (QID) | RESPIRATORY_TRACT | 2 refills | Status: DC | PRN

## 2020-09-24 NOTE — Progress Notes (Signed)
[facility] [address] [place], [place] [address]  Internal MEDICINE  Office Visit Note  Patient Name: Melanie Briggs  [id number]  [id number]  Date of Service: 09/25/2020  Chief Complaint  Patient presents with  . Annual Exam  . Shortness of Breath    Started in April after Covid now having it more frequent  . Hypertension     HPI Pt is here for routine health maintenance examination Overall things have been going well, the holidays were rough due to the recent passing of her mother She was able to spend time with her family which helped her with her grieving  Headaches are improving, instead of daily episodes she is now having them twice or three times a week, has been monitoring her BP and it is well controlled--history of vestibular migraines, has recently been started on toprolol XL 100 mg daily for symptom control, has not been seen by neurologist in several years Her sleep has also improved, not having any difficulty with sleeping, has been using Melatonin and this is working well for her--did not try Remeron, sleep improved before she had a chance to pick up medication Also feels her depression is slowly improving, feels she is starting to accept the loss of her mother and able to cope better  History of hysterectomy--has not had vaginal exam since, not experiencing any vaginal symptoms at this time  C/o shortness of breath with exertion, s/p COVID in April, has not had follow-up imaging, SHOB has been ongoing since COVID infection but she feels it is happening more frequently-will become [facility] when active, will sit down to rest and is able to recover quickly, no associated chest pain with episodes, no coughing or edema in lower extremities  History of chronic neck pain-has had PT in the past that was helpful--again was being managed by neurology but has not been back in several years  PHM: Mammogram needs to be updated Has not yet had labs done for this  year  Current Medication: Outpatient Encounter Medications as of 09/24/2020  Medication Sig  . acetaminophen (TYLENOL) 500 MG tablet Take 1,000 mg by mouth daily as needed for headache.   . albuterol (VENTOLIN HFA) 108 (90 Base) MCG/ACT inhaler Inhale 2 puffs into the lungs every 6 (six) hours as needed for wheezing or shortness of breath.  Marland Kitchen amLODipine (NORVASC) 5 MG tablet Take 1 tablet (5 mg total) by mouth daily.  . diphenhydrAMINE (BENADRYL) 25 MG tablet Take 25 mg by mouth every 6 (six) hours as needed (allergic reaction).  Marland Kitchen escitalopram (LEXAPRO) 20 MG tablet Take 1 tablet (20 mg total) by mouth daily.  . hydrochlorothiazide (HYDRODIURIL) 25 MG tablet Take 1 tablet (25 mg total) by mouth daily.  Marland Kitchen ibuprofen (ADVIL,MOTRIN) 600 MG tablet Take 1 tablet (600 mg total) by mouth every 8 (eight) hours as needed.  . lidocaine (XYLOCAINE) 2 % solution Use as directed 5 mLs in the mouth or throat every 6 (six) hours as needed for mouth pain.  . naproxen sodium (ANAPROX) 220 MG tablet Take 220 mg by mouth daily as needed (pain).  . [DISCONTINUED] cyclobenzaprine (FLEXERIL) 10 MG tablet Take 1 tablet (10 mg total) by mouth 3 (three) times daily as needed for muscle spasms.  . [DISCONTINUED] loratadine-pseudoephedrine (CLARITIN-D 12 HOUR) 5-120 MG per tablet Take 1 tablet by mouth 2 (two) times daily. For congestion.  . [DISCONTINUED] metoprolol succinate (TOPROL-XL) 50 MG 24 hr tablet Take 100 mg by mouth daily. Take with or immediately  following a meal.  . [DISCONTINUED] mirtazapine (REMERON) 7.5 MG tablet Take 1 tablet (7.5 mg total) by mouth at bedtime.  . [DISCONTINUED] Phenylephrine-DM-GG-APAP 5-10-200-325 MG CAPS Take 2 tablets by mouth daily as needed (allergies).  . [DISCONTINUED] pseudoephedrine (SUDAFED) 30 MG tablet Take 30 mg by mouth every 4 (four) hours as needed for congestion.  . [DISCONTINUED] Tetrahydrozoline HCl (VISINE OP) Apply 2 drops to eye daily as needed (burning/itching).  .  metoprolol succinate (TOPROL-XL) 50 MG 24 hr tablet Take 2 tablets (100 mg total) by mouth daily. Take with or immediately following a meal.  . [DISCONTINUED] famotidine (PEPCID) 40 MG tablet Take 1 tablet (40 mg total) by mouth every evening.   No facility-administered encounter medications on file as of 09/24/2020.    Surgical History: Past Surgical History:  Procedure Laterality Date  . ABDOMINAL HYSTERECTOMY    . TUMOR EXCISION N/A     Medical History: Past Medical History:  Diagnosis Date  . Bronchitis   . Hypertension   . Migraine     Family History: Family History  Problem Relation Age of Onset  . Diabetes Other   . Diabetes Mother   . Kidney failure Mother   . Pancreatitis Mother   . Hyperlipidemia Father       Review of Systems  Constitutional: Negative for chills, diaphoresis and fatigue.  HENT: Negative for ear pain, postnasal drip and sinus pressure.   Eyes: Negative for photophobia, discharge, redness, itching and visual disturbance.  Respiratory: Positive for shortness of breath. Negative for cough and wheezing.   Cardiovascular: Negative for chest pain, palpitations and leg swelling.  Gastrointestinal: Negative for abdominal pain, constipation, diarrhea, nausea and vomiting.  Genitourinary: Negative for dysuria and flank pain.  Musculoskeletal: Positive for neck pain. Negative for arthralgias, back pain and gait problem.  Skin: Negative for color change.  Allergic/Immunologic: Negative for environmental allergies and food allergies.  Neurological: Positive for headaches. Negative for dizziness.  Hematological: Does not bruise/bleed easily.  Psychiatric/Behavioral: Negative for agitation, behavioral problems (depression) and hallucinations.     Vital Signs: BP 136/90   Pulse 69   Temp (!) 97.5 F (36.4 C)   Resp 16   Ht 5\' 6"  (1.676 m)   Wt 195 lb 12.8 oz (88.8 kg)   SpO2 98%   BMI 31.60 kg/m    Physical Exam Vitals reviewed.   Constitutional:      Appearance: She is well-developed. She is obese.  HENT:     Right Ear: Tympanic membrane normal.     Left Ear: Tympanic membrane normal.     Nose: Nose normal.     Mouth/Throat:     Mouth: Mucous membranes are moist.  Eyes:     Pupils: Pupils are equal, round, and reactive to light.  Cardiovascular:     Rate and Rhythm: Normal rate and regular rhythm.     Pulses: Normal pulses.     Heart sounds: Normal heart sounds.  Pulmonary:     Effort: Pulmonary effort is normal.     Breath sounds: Normal breath sounds.  Abdominal:     General: Abdomen is flat.     Palpations: Abdomen is soft.  Musculoskeletal:        General: Normal range of motion.     Cervical back: Normal range of motion.  Skin:    General: Skin is warm.  Neurological:     General: No focal deficit present.     Mental Status: She is alert and oriented  to person, place, and time. Mental status is at baseline.  Psychiatric:        Mood and Affect: Mood normal.        Behavior: Behavior normal.        Thought Content: Thought content normal.        Judgment: Judgment normal.    LABS: No results found for this or any previous visit (from the past 2160 hour(s)).  Assessment/Plan: 1. Encounter for routine adult health examination with abnormal findings Well appearing 50 year old AA female Will review routine labs and adjust therapy as indicated Mammogram order placed to update PHM - CBC w/Diff/Platelet - Comprehensive Metabolic Panel (CMET) - Lipid Panel With LDL/HDL Ratio - TSH + free T4  2. Encounter for screening mammogram for malignant neoplasm of breast - MM Digital Screening; Future  3. Dyspnea on exertion Will review CXR, follow-up imaging s/p COVID, may use albuterol inhaler as needed for SHOB - albuterol (VENTOLIN HFA) 108 (90 Base) MCG/ACT inhaler; Inhale 2 puffs into the lungs every 6 (six) hours as needed for wheezing or shortness of breath.  Dispense: 8 g; Refill: 2 - DG  Chest 2 View; Future  4. Chronic nonintractable headache, unspecified headache type Requesting refills of Toprol previously prescribed by different provider, HR stable today--will closely monitor Refer to neurology for further evaluation and management - Ambulatory referral to Neurology - metoprolol succinate (TOPROL-XL) 50 MG 24 hr tablet; Take 2 tablets (100 mg total) by mouth daily. Take with or immediately following a meal.  Dispense: 180 tablet; Refill: 0  5. Essential hypertension BP well controlled today--continue current therapy and routine monitoring  6. Depression, major, single episode, moderate (HCC) Continue with current Lexapro dose as symptoms remain well controlled--continue to closely monitor  7. Chronic neck pain Referral to neurology as well as PT-unable to tolerate muscle relaxants - Ambulatory referral to Neurology - Ambulatory referral to Physical Therapy  General Counseling: Tristian verbalizes understanding of the findings of todays visit and agrees with plan of treatment. I have discussed any further diagnostic evaluation that may be needed or ordered today. We also reviewed her medications today. she has been encouraged to call the office with any questions or concerns that should arise related to todays visit.    Counseling:  Orders Placed This Encounter  Procedures  . MM Digital Screening  . DG Chest 2 View  . CBC w/Diff/Platelet  . Comprehensive Metabolic Panel (CMET)  . Lipid Panel With LDL/HDL Ratio  . TSH + free T4  . Ambulatory referral to Neurology  . Ambulatory referral to Physical Therapy    Meds ordered this encounter  Medications  . albuterol (VENTOLIN HFA) 108 (90 Base) MCG/ACT inhaler    Sig: Inhale 2 puffs into the lungs every 6 (six) hours as needed for wheezing or shortness of breath.    Dispense:  8 g    Refill:  2  . metoprolol succinate (TOPROL-XL) 50 MG 24 hr tablet    Sig: Take 2 tablets (100 mg total) by mouth daily. Take  with or immediately following a meal.    Dispense:  180 tablet    Refill:  0    Total time spent: 30 Minutes  Time spent includes review of chart, medications, test results, and follow up plan with the patient.   This patient was seen by Brent General AGNP-C Collaboration with Dr Lyndon Code as a part of collaborative care agreement   Lubertha Basque. [facility] Internal Medicine

## 2020-09-24 NOTE — Telephone Encounter (Signed)
Patient advised to only have one PCP. She stated she wanted to stay with [facility] for her primary care.

## 2020-09-25 ENCOUNTER — Encounter: Payer: Self-pay | Admitting: Hospice and Palliative Medicine

## 2020-09-25 ENCOUNTER — Other Ambulatory Visit: Payer: Self-pay | Admitting: Hospice and Palliative Medicine

## 2020-09-25 ENCOUNTER — Ambulatory Visit
Admission: RE | Admit: 2020-09-25 | Discharge: 2020-09-25 | Disposition: A | Discharge status: Home / Self Care | Payer: BC Managed Care – PPO | Attending: Hospice and Palliative Medicine | Admitting: Hospice and Palliative Medicine

## 2020-09-25 ENCOUNTER — Ambulatory Visit
Admission: RE | Admit: 2020-09-25 | Discharge: 2020-09-25 | Disposition: A | Discharge status: Home / Self Care | Payer: BC Managed Care – PPO | Source: Ambulatory Visit | Attending: Hospice and Palliative Medicine | Admitting: Hospice and Palliative Medicine

## 2020-09-25 DIAGNOSIS — R0600 Dyspnea, unspecified: Secondary | ICD-10-CM | POA: Diagnosis not present

## 2020-09-25 DIAGNOSIS — R059 Cough, unspecified: Secondary | ICD-10-CM | POA: Diagnosis not present

## 2020-09-25 DIAGNOSIS — R0609 Other forms of dyspnea: Secondary | ICD-10-CM

## 2020-09-25 DIAGNOSIS — R0602 Shortness of breath: Secondary | ICD-10-CM | POA: Diagnosis not present

## 2020-10-08 ENCOUNTER — Ambulatory Visit: Payer: BC Managed Care – PPO

## 2020-10-08 DIAGNOSIS — R0600 Dyspnea, unspecified: Secondary | ICD-10-CM

## 2020-10-08 DIAGNOSIS — R0609 Other forms of dyspnea: Secondary | ICD-10-CM

## 2020-10-08 DIAGNOSIS — R0602 Shortness of breath: Secondary | ICD-10-CM | POA: Diagnosis not present

## 2020-11-04 DIAGNOSIS — Z0001 Encounter for general adult medical examination with abnormal findings: Secondary | ICD-10-CM | POA: Diagnosis not present

## 2020-11-04 DIAGNOSIS — R300 Dysuria: Secondary | ICD-10-CM | POA: Diagnosis not present

## 2020-11-05 ENCOUNTER — Encounter: Payer: Self-pay | Admitting: Hospice and Palliative Medicine

## 2020-11-05 ENCOUNTER — Ambulatory Visit (INDEPENDENT_AMBULATORY_CARE_PROVIDER_SITE_OTHER): Payer: BC Managed Care – PPO | Admitting: Hospice and Palliative Medicine

## 2020-11-05 ENCOUNTER — Other Ambulatory Visit: Payer: Self-pay

## 2020-11-05 VITALS — BP 110/76 | HR 71 | Temp 97.3°F | Resp 16 | Ht 66.0 in | Wt 195.2 lb

## 2020-11-05 DIAGNOSIS — E119 Type 2 diabetes mellitus without complications: Secondary | ICD-10-CM

## 2020-11-05 DIAGNOSIS — F411 Generalized anxiety disorder: Secondary | ICD-10-CM

## 2020-11-05 DIAGNOSIS — G8929 Other chronic pain: Secondary | ICD-10-CM

## 2020-11-05 DIAGNOSIS — F331 Major depressive disorder, recurrent, moderate: Secondary | ICD-10-CM | POA: Diagnosis not present

## 2020-11-05 DIAGNOSIS — R7301 Impaired fasting glucose: Secondary | ICD-10-CM | POA: Diagnosis not present

## 2020-11-05 DIAGNOSIS — R519 Headache, unspecified: Secondary | ICD-10-CM | POA: Diagnosis not present

## 2020-11-05 LAB — TSH+FREE T4
Free T4: 1 ng/dL (ref 0.82–1.77)
TSH: 2.4 u[IU]/mL (ref 0.450–4.500)

## 2020-11-05 LAB — COMPREHENSIVE METABOLIC PANEL
ALT: 49 IU/L — ABNORMAL HIGH (ref 0–32)
AST: 50 IU/L — ABNORMAL HIGH (ref 0–40)
Albumin/Globulin Ratio: 1.2 (ref 1.2–2.2)
Albumin: 4.3 g/dL (ref 3.8–4.8)
Alkaline Phosphatase: 57 IU/L (ref 44–121)
BUN/Creatinine Ratio: 10 (ref 9–23)
BUN: 8 mg/dL (ref 6–24)
Bilirubin Total: 0.6 mg/dL (ref 0.0–1.2)
CO2: 19 mmol/L — ABNORMAL LOW (ref 20–29)
Calcium: 9.6 mg/dL (ref 8.7–10.2)
Chloride: 101 mmol/L (ref 96–106)
Creatinine, Ser: 0.79 mg/dL (ref 0.57–1.00)
GFR calc Af Amer: 103 mL/min/{1.73_m2} (ref >59)
GFR calc non Af Amer: 89 mL/min/{1.73_m2} (ref >59)
Globulin, Total: 3.6 g/dL (ref 1.5–4.5)
Glucose: 125 mg/dL — ABNORMAL HIGH (ref 65–99)
Potassium: 4.4 mmol/L (ref 3.5–5.2)
Sodium: 137 mmol/L (ref 134–144)
Total Protein: 7.9 g/dL (ref 6.0–8.5)

## 2020-11-05 LAB — POCT GLYCOSYLATED HEMOGLOBIN (HGB A1C): Hemoglobin A1C: 6.6 % — AB (ref 4.0–5.6)

## 2020-11-05 LAB — LIPID PANEL WITH LDL/HDL RATIO
Cholesterol, Total: 217 mg/dL — ABNORMAL HIGH (ref 100–199)
HDL: 53 mg/dL (ref >39)
LDL Chol Calc (NIH): 144 mg/dL — ABNORMAL HIGH (ref 0–99)
LDL/HDL Ratio: 2.7 ratio (ref 0.0–3.2)
Triglycerides: 114 mg/dL (ref 0–149)
VLDL Cholesterol Cal: 20 mg/dL (ref 5–40)

## 2020-11-05 LAB — CBC WITH DIFFERENTIAL/PLATELET
Basophils Absolute: 0.1 10*3/uL (ref 0.0–0.2)
Basos: 1 %
EOS (ABSOLUTE): 0.3 10*3/uL (ref 0.0–0.4)
Eos: 4 %
Hematocrit: 38.4 % (ref 34.0–46.6)
Hemoglobin: 13.1 g/dL (ref 11.1–15.9)
Immature Grans (Abs): 0 10*3/uL (ref 0.0–0.1)
Immature Granulocytes: 0 %
Lymphocytes Absolute: 2.7 10*3/uL (ref 0.7–3.1)
Lymphs: 37 %
MCH: 32 pg (ref 26.6–33.0)
MCHC: 34.1 g/dL (ref 31.5–35.7)
MCV: 94 fL (ref 79–97)
Monocytes Absolute: 0.4 10*3/uL (ref 0.1–0.9)
Monocytes: 6 %
Neutrophils Absolute: 3.8 10*3/uL (ref 1.4–7.0)
Neutrophils: 52 %
Platelets: 201 10*3/uL (ref 150–450)
RBC: 4.1 x10E6/uL (ref 3.77–5.28)
RDW: 12 % (ref 11.7–15.4)
WBC: 7.2 10*3/uL (ref 3.4–10.8)

## 2020-11-05 MED ORDER — TOPIRAMATE 25 MG PO TABS
25.0000 mg | ORAL_TABLET | Freq: Every day | ORAL | 0 refills | Status: DC
Start: 2020-11-05 — End: 2020-11-13

## 2020-11-05 MED ORDER — DAPAGLIFLOZIN PROPANEDIOL 5 MG PO TABS
5.0000 mg | ORAL_TABLET | Freq: Every day | ORAL | 3 refills | Status: DC
Start: 2020-11-05 — End: 2020-11-13

## 2020-11-05 NOTE — Progress Notes (Signed)
[facility] [address] [place], [place] [address]  Internal MEDICINE  Office Visit Note  Patient Name: Melanie Briggs  [id number]  [id number]  Date of Service: 11/07/2020  Chief Complaint  Patient presents with  . Follow-up    Review labs, FMLA paperwork, headaches, headache since last visit, using inhaler more, dizziness more frequently, lost balance yesterday fell into door  . Hypertension  . Depression  . Quality Metric Gaps    Colonoscopy    HPI Patient is here for routine follow-up Continues to struggle with depression due to the loss of her mother last year Having increased headaches, daily headaches since our last visit, has been on metoprolol therapy for several years for migraine prevention, dosing was recently increased but has not made an improvement Had a trip up yesterday and fell into the door after feeling lightheaded and dizziness Not sleeping well, feels overwhelmed, continues to take on a lot of her families responsibilities since the passing of her mother Since our last visit has only been able to work a few hours--unable to focus and concentrate at work [employer] paperwork along with her today that needs to be renewed--discussed with her at this time, we will need to refer her to appropriate specialists for further evaluation---needs current FMLA filled out since January 31st, will approve until March 1st to allow appropriate timeframe to be evaluated by specialists  Reviewed her recent labs with her--glucose levels elevated, liver enzymes elevated, lipid panel abnormal   Current Medication: Outpatient Encounter Medications as of 11/05/2020  Medication Sig  . dapagliflozin propanediol (FARXIGA) 5 MG TABS tablet Take 1 tablet (5 mg total) by mouth daily before breakfast.  . topiramate (TOPAMAX) 25 MG tablet Take 1 tablet (25 mg total) by mouth daily.  Marland Kitchen acetaminophen (TYLENOL) 500 MG tablet Take 1,000 mg by mouth daily as needed for headache.    . albuterol (VENTOLIN HFA) 108 (90 Base) MCG/ACT inhaler Inhale 2 puffs into the lungs every 6 (six) hours as needed for wheezing or shortness of breath.  Marland Kitchen amLODipine (NORVASC) 5 MG tablet Take 1 tablet (5 mg total) by mouth daily.  . diphenhydrAMINE (BENADRYL) 25 MG tablet Take 25 mg by mouth every 6 (six) hours as needed (allergic reaction).  Marland Kitchen escitalopram (LEXAPRO) 20 MG tablet Take 1 tablet (20 mg total) by mouth daily.  . hydrochlorothiazide (HYDRODIURIL) 25 MG tablet Take 1 tablet (25 mg total) by mouth daily.  Marland Kitchen ibuprofen (ADVIL,MOTRIN) 600 MG tablet Take 1 tablet (600 mg total) by mouth every 8 (eight) hours as needed.  . lidocaine (XYLOCAINE) 2 % solution Use as directed 5 mLs in the mouth or throat every 6 (six) hours as needed for mouth pain.  . metoprolol succinate (TOPROL-XL) 50 MG 24 hr tablet Take 2 tablets (100 mg total) by mouth daily. Take with or immediately following a meal.  . naproxen sodium (ANAPROX) 220 MG tablet Take 220 mg by mouth daily as needed (pain).   No facility-administered encounter medications on file as of 11/05/2020.    Surgical History: Past Surgical History:  Procedure Laterality Date  . ABDOMINAL HYSTERECTOMY    . TUMOR EXCISION N/A     Medical History: Past Medical History:  Diagnosis Date  . Bronchitis   . Depression   . Hypertension   . Migraine     Family History: Family History  Problem Relation Age of Onset  . Diabetes Other   . Diabetes Mother   . Kidney failure Mother   .  Pancreatitis Mother   . Hyperlipidemia Father     Social History   Socioeconomic History  . Marital status: Single    Spouse name: Not on file  . Number of children: Not on file  . Years of education: Not on file  . Highest education level: Not on file  Occupational History  . Not on file  Tobacco Use  . Smoking status: Former Smoker    Years: 3.00    Types: Cigarettes  . Smokeless tobacco: Never Used  Substance and Sexual Activity  . Alcohol  use: Yes    Comment: socailly  . Drug use: No  . Sexual activity: Not on file  Other Topics Concern  . Not on file  Social History Narrative  . Not on file   Social Determinants of Health   Financial Resource Strain: Not on file  Food Insecurity: Not on file  Transportation Needs: Not on file  Physical Activity: Not on file  Stress: Not on file  Social Connections: Not on file  Intimate Partner Violence: Not on file      Review of Systems  Constitutional: Positive for fatigue. Negative for chills and diaphoresis.  HENT: Negative for ear pain, postnasal drip and sinus pressure.   Eyes: Negative for photophobia, discharge, redness, itching and visual disturbance.  Respiratory: Negative for cough, shortness of breath and wheezing.   Cardiovascular: Negative for chest pain, palpitations and leg swelling.  Gastrointestinal: Negative for abdominal pain, constipation, diarrhea, nausea and vomiting.  Genitourinary: Negative for dysuria and flank pain.  Musculoskeletal: Negative for arthralgias, back pain, gait problem and neck pain.  Skin: Negative for color change.  Allergic/Immunologic: Negative for environmental allergies and food allergies.  Neurological: Positive for dizziness, light-headedness and headaches.  Hematological: Does not bruise/bleed easily.  Psychiatric/Behavioral: Positive for behavioral problems (depression) and sleep disturbance. Negative for agitation and hallucinations. The patient is nervous/anxious.     Vital Signs: BP 110/76   Pulse 71   Temp (!) 97.3 F (36.3 C)   Resp 16   Ht 5\' 6"  (1.676 m)   Wt 195 lb 3.2 oz (88.5 kg)   SpO2 99%   BMI 31.51 kg/m    Physical Exam Vitals reviewed.  Constitutional:      Appearance: Normal appearance. She is obese.  Cardiovascular:     Rate and Rhythm: Normal rate and regular rhythm.     Pulses: Normal pulses.     Heart sounds: Normal heart sounds.  Pulmonary:     Effort: Pulmonary effort is normal.      Breath sounds: Normal breath sounds.  Abdominal:     General: Abdomen is flat.     Palpations: Abdomen is soft.  Musculoskeletal:        General: Normal range of motion.     Cervical back: Normal range of motion.  Skin:    General: Skin is warm.  Neurological:     General: No focal deficit present.     Mental Status: She is alert and oriented to person, place, and time. Mental status is at baseline.  Psychiatric:        Mood and Affect: Mood normal.        Behavior: Behavior normal.        Thought Content: Thought content normal.        Judgment: Judgment normal.    Assessment/Plan: 1. New onset type 2 diabetes mellitus (HCC) A1C 6.6 Start low dose Farxiga Overwhelmed by new diagnosis--will work on checking glucose levels  at home at next visit Prior to next visit will work on decreasing amount of sugar containing drinks, routinely drink lemonade and fruit juices, occasionally ginger ale  - POCT HgB A1C - dapagliflozin propanediol (FARXIGA) 5 MG TABS tablet; Take 1 tablet (5 mg total) by mouth daily before breakfast.  Dispense: 30 tablet; Refill: 3  2. GAD (generalized anxiety disorder) Referral to psychiatry for further evaluation and management - Ambulatory referral to Psychiatry  3. Moderate episode of recurrent major depressive disorder (HCC) Continue with Lexapro--psychiatry referral  4. Chronic nonintractable headache, unspecified headache type Decrease dose of metoprolol to 1 tablet daily, add low dose Topamax--referral to neurology for further evaluation and managmn - topiramate (TOPAMAX) 25 MG tablet; Take 1 tablet (25 mg total) by mouth daily.  Dispense: 90 tablet; Refill: 0  General Counseling: Mikaiya verbalizes understanding of the findings of todays visit and agrees with plan of treatment. I have discussed any further diagnostic evaluation that may be needed or ordered today. We also reviewed her medications today. she has been encouraged to call the office with  any questions or concerns that should arise related to todays visit.    Orders Placed This Encounter  Procedures  . Ambulatory referral to Psychiatry  . POCT HgB A1C    Meds ordered this encounter  Medications  . topiramate (TOPAMAX) 25 MG tablet    Sig: Take 1 tablet (25 mg total) by mouth daily.    Dispense:  90 tablet    Refill:  0  . dapagliflozin propanediol (FARXIGA) 5 MG TABS tablet    Sig: Take 1 tablet (5 mg total) by mouth daily before breakfast.    Dispense:  30 tablet    Refill:  3    Time spent: 30 Minutes Time spent includes review of chart, medications, test results and follow-up plan with the patient.  This patient was seen by Leeanne Deed AGNP-C in Collaboration with Dr Lyndon Code as a part of collaborative care agreement     Lubertha Basque. Chiyoko Torrico AGNP-C Internal medicine

## 2020-11-07 ENCOUNTER — Encounter: Payer: Self-pay | Admitting: Hospice and Palliative Medicine

## 2020-11-07 LAB — UA/M W/RFLX CULTURE, ROUTINE
Bilirubin, UA: NEGATIVE
Glucose, UA: NEGATIVE
Ketones, UA: NEGATIVE
Nitrite, UA: NEGATIVE
Protein,UA: NEGATIVE
RBC, UA: NEGATIVE
Specific Gravity, UA: 1.016 (ref 1.005–1.030)
Urobilinogen, Ur: 1 mg/dL (ref 0.2–1.0)
pH, UA: 5.5 (ref 5.0–7.5)

## 2020-11-07 LAB — MICROSCOPIC EXAMINATION
Casts: NONE SEEN /lpf
RBC, Urine: NONE SEEN /hpf (ref 0–2)

## 2020-11-07 LAB — URINE CULTURE, REFLEX

## 2020-11-10 ENCOUNTER — Telehealth: Payer: Self-pay

## 2020-11-10 NOTE — Telephone Encounter (Signed)
Patient disability paperwork ready for pick up and advised continuous leave will not get completed from pcp and will need to be managed with referring providers for psychiatry and neurology after 11/11/20. Sharifa Bucholz

## 2020-11-13 ENCOUNTER — Ambulatory Visit (INDEPENDENT_AMBULATORY_CARE_PROVIDER_SITE_OTHER): Payer: BC Managed Care – PPO | Admitting: Hospice and Palliative Medicine

## 2020-11-13 ENCOUNTER — Encounter: Payer: Self-pay | Admitting: Hospice and Palliative Medicine

## 2020-11-13 ENCOUNTER — Other Ambulatory Visit: Payer: Self-pay

## 2020-11-13 VITALS — BP 110/70 | HR 72 | Temp 97.4°F | Resp 16 | Ht 66.0 in | Wt 192.0 lb

## 2020-11-13 DIAGNOSIS — F5101 Primary insomnia: Secondary | ICD-10-CM

## 2020-11-13 DIAGNOSIS — E119 Type 2 diabetes mellitus without complications: Secondary | ICD-10-CM

## 2020-11-13 DIAGNOSIS — R519 Headache, unspecified: Secondary | ICD-10-CM | POA: Diagnosis not present

## 2020-11-13 DIAGNOSIS — G8929 Other chronic pain: Secondary | ICD-10-CM

## 2020-11-13 DIAGNOSIS — F331 Major depressive disorder, recurrent, moderate: Secondary | ICD-10-CM

## 2020-11-13 MED ORDER — TRAZODONE HCL 50 MG PO TABS: 25.0000 mg | ORAL_TABLET | Freq: Every evening | ORAL | 3 refills | Status: DC | PRN

## 2020-11-13 MED ORDER — DAPAGLIFLOZIN PROPANEDIOL 5 MG PO TABS: 5.0000 mg | ORAL_TABLET | Freq: Every day | ORAL | 0 refills | Status: DC

## 2020-11-13 MED ORDER — ACCU-CHEK GUIDE VI STRP: ORAL_STRIP | 12 refills | Status: DC

## 2020-11-13 MED ORDER — ACCU-CHEK SOFTCLIX LANCETS MISC: 12 refills | Status: DC

## 2020-11-13 NOTE — Progress Notes (Signed)
Gave patient instructions on how to use Accu-chek glucose machine, also gave patient a meal planning guid, BS log and carb book.

## 2020-11-13 NOTE — Progress Notes (Signed)
[facility] [address] [place], [place] [address]  Internal MEDICINE  Office Visit Note  Patient Name: Melanie Briggs  [id number]  [id number]  Date of Service: 11/14/2020  Chief Complaint  Patient presents with  . Follow-up    Discuss diabetes, headaches, paperwork, refills, discuss meds and side effects   . Depression  . Hypertension  . Diabetes    HPI Patient is here for routine follow-up Close follow-up from last week from new diagnosis of T2DM Was started on Farxiga at last visit, tolerating medication well--has also made significant lifestyle modifications in her diet  Neurology appt in April Had a second fall from dizziness Continues to use Tens unit for neck pain Continues to have daily headaches Awaiting on appointment from psychiatry  Started on Topamax at last visit for migraine prevention, was not able to tolerate Noticed tremors in her hands and feet after taking Topamax--tried a few doses with the same effect each time Continues to have trouble with sleeping--trouble with both falling and staying asleep Has tried Melatonin, some nights this is helpful and others she continues to struggle  Current Medication: Outpatient Encounter Medications as of 11/13/2020  Medication Sig  . Accu-Chek Softclix Lancets lancets Use to check blood sugars twice a day  E11.65  . dapagliflozin propanediol (FARXIGA) 5 MG TABS tablet Take 1 tablet (5 mg total) by mouth daily before breakfast.  . glucose blood (ACCU-CHEK GUIDE) test strip Use to check blood sugars twice a day  E11.65  . metoprolol succinate (TOPROL-XL) 50 MG 24 hr tablet Take 50 mg by mouth daily. Take with or immediately following a meal.  . traZODone (DESYREL) 50 MG tablet Take 0.5-1 tablets (25-50 mg total) by mouth at bedtime as needed for sleep.  Marland Kitchen acetaminophen (TYLENOL) 500 MG tablet Take 1,000 mg by mouth daily as needed for headache.   . albuterol (VENTOLIN HFA) 108 (90 Base) MCG/ACT inhaler Inhale  2 puffs into the lungs every 6 (six) hours as needed for wheezing or shortness of breath.  Marland Kitchen amLODipine (NORVASC) 5 MG tablet Take 1 tablet (5 mg total) by mouth daily.  . diphenhydrAMINE (BENADRYL) 25 MG tablet Take 25 mg by mouth every 6 (six) hours as needed (allergic reaction).  Marland Kitchen escitalopram (LEXAPRO) 20 MG tablet Take 1 tablet (20 mg total) by mouth daily.  . hydrochlorothiazide (HYDRODIURIL) 25 MG tablet Take 1 tablet (25 mg total) by mouth daily.  Marland Kitchen ibuprofen (ADVIL,MOTRIN) 600 MG tablet Take 1 tablet (600 mg total) by mouth every 8 (eight) hours as needed.  . lidocaine (XYLOCAINE) 2 % solution Use as directed 5 mLs in the mouth or throat every 6 (six) hours as needed for mouth pain.  . naproxen sodium (ANAPROX) 220 MG tablet Take 220 mg by mouth daily as needed (pain).  . [DISCONTINUED] dapagliflozin propanediol (FARXIGA) 5 MG TABS tablet Take 1 tablet (5 mg total) by mouth daily before breakfast.  . [DISCONTINUED] metoprolol succinate (TOPROL-XL) 50 MG 24 hr tablet Take 2 tablets (100 mg total) by mouth daily. Take with or immediately following a meal.  . [DISCONTINUED] topiramate (TOPAMAX) 25 MG tablet Take 1 tablet (25 mg total) by mouth daily.   No facility-administered encounter medications on file as of 11/13/2020.    Surgical History: Past Surgical History:  Procedure Laterality Date  . ABDOMINAL HYSTERECTOMY    . TUMOR EXCISION N/A     Medical History: Past Medical History:  Diagnosis Date  . Bronchitis   . Depression   .  Diabetes mellitus without complication (HCC)   . Hypertension   . Migraine     Family History: Family History  Problem Relation Age of Onset  . Diabetes Other   . Diabetes Mother   . Kidney failure Mother   . Pancreatitis Mother   . Hyperlipidemia Father     Social History   Socioeconomic History  . Marital status: Single    Spouse name: Not on file  . Number of children: Not on file  . Years of education: Not on file  . Highest  education level: Not on file  Occupational History  . Not on file  Tobacco Use  . Smoking status: Former Smoker    Years: 3.00    Types: Cigarettes  . Smokeless tobacco: Never Used  Substance and Sexual Activity  . Alcohol use: Yes    Comment: socailly  . Drug use: No  . Sexual activity: Not on file  Other Topics Concern  . Not on file  Social History Narrative  . Not on file   Social Determinants of Health   Financial Resource Strain: Not on file  Food Insecurity: Not on file  Transportation Needs: Not on file  Physical Activity: Not on file  Stress: Not on file  Social Connections: Not on file  Intimate Partner Violence: Not on file      Review of Systems  Constitutional: Negative for chills, diaphoresis and fatigue.  HENT: Negative for ear pain, postnasal drip and sinus pressure.   Eyes: Negative for photophobia, discharge, redness, itching and visual disturbance.  Respiratory: Negative for cough, shortness of breath and wheezing.   Cardiovascular: Negative for chest pain, palpitations and leg swelling.  Gastrointestinal: Negative for abdominal pain, constipation, diarrhea, nausea and vomiting.  Genitourinary: Negative for dysuria and flank pain.  Musculoskeletal: Positive for neck pain and neck stiffness. Negative for arthralgias, back pain and gait problem.  Skin: Negative for color change.  Allergic/Immunologic: Negative for environmental allergies and food allergies.  Neurological: Positive for headaches. Negative for dizziness.  Hematological: Does not bruise/bleed easily.  Psychiatric/Behavioral: Positive for behavioral problems (depression) and sleep disturbance. Negative for agitation and hallucinations.    Vital Signs: BP 110/70   Pulse 72   Temp (!) 97.4 F (36.3 C)   Resp 16   Ht 5\' 6"  (1.676 m)   Wt 192 lb (87.1 kg)   SpO2 99%   BMI 30.99 kg/m    Physical Exam Vitals reviewed.  Constitutional:      Appearance: Normal appearance. She is  normal weight.  Cardiovascular:     Rate and Rhythm: Normal rate and regular rhythm.     Pulses: Normal pulses.     Heart sounds: Normal heart sounds.  Pulmonary:     Effort: Pulmonary effort is normal.     Breath sounds: Normal breath sounds.  Abdominal:     General: Abdomen is flat.     Palpations: Abdomen is soft.  Musculoskeletal:        General: Normal range of motion.     Cervical back: Normal range of motion.  Skin:    General: Skin is warm.  Neurological:     General: No focal deficit present.     Mental Status: She is alert and oriented to person, place, and time. Mental status is at baseline.  Psychiatric:        Mood and Affect: Mood normal.        Behavior: Behavior normal.  Thought Content: Thought content normal.        Judgment: Judgment normal.   Assessment/Plan: 1. New onset type 2 diabetes mellitus (HCC) Tolerating Farxiga well Set up and educated in office today with glucometer Encouraged to monitor fasting glucose a few times per week and bring a log at next follow-up for review - dapagliflozin propanediol (FARXIGA) 5 MG TABS tablet; Take 1 tablet (5 mg total) by mouth daily before breakfast.  Dispense: 90 tablet; Refill: 0 - Accu-Chek Softclix Lancets lancets; Use to check blood sugars twice a day  E11.65  Dispense: 100 each; Refill: 12 - glucose blood (ACCU-CHEK GUIDE) test strip; Use to check blood sugars twice a day  E11.65  Dispense: 100 each; Refill: 12  2. Primary insomnia Start Trazodone to help with insomnia--advised to start with 25 mg for 3-4 nights and to increase to 50 mg if continues to struggle with insomnis - traZODone (DESYREL) 50 MG tablet; Take 0.5-1 tablets (25-50 mg total) by mouth at bedtime as needed for sleep.  Dispense: 30 tablet; Refill: 3  3. Chronic nonintractable headache, unspecified headache type Discontinue Topamax due to negative side effects Continue with Toprol while awaiting for appointment with neurology -  metoprolol succinate (TOPROL-XL) 50 MG 24 hr tablet; Take 50 mg by mouth daily. Take with or immediately following a meal.  4. Moderate episode of recurrent major depressive disorder (HCC) Symptoms continue to wax and wane--awaiting referral request from psychiatry  General Counseling: Bryna verbalizes understanding of the findings of todays visit and agrees with plan of treatment. I have discussed any further diagnostic evaluation that may be needed or ordered today. We also reviewed her medications today. she has been encouraged to call the office with any questions or concerns that should arise related to todays visit.  Completed FMLA paperwork given back today Requesting to have a reduced schedule or temporary part time hours until appointment with neurology--will need to discuss available options through her employer.   Meds ordered this encounter  Medications  . dapagliflozin propanediol (FARXIGA) 5 MG TABS tablet    Sig: Take 1 tablet (5 mg total) by mouth daily before breakfast.    Dispense:  90 tablet    Refill:  0  . traZODone (DESYREL) 50 MG tablet    Sig: Take 0.5-1 tablets (25-50 mg total) by mouth at bedtime as needed for sleep.    Dispense:  30 tablet    Refill:  3  . Accu-Chek Softclix Lancets lancets    Sig: Use to check blood sugars twice a day  E11.65    Dispense:  100 each    Refill:  12  . glucose blood (ACCU-CHEK GUIDE) test strip    Sig: Use to check blood sugars twice a day  E11.65    Dispense:  100 each    Refill:  12    Time spent: 30 Minutes Time spent includes review of chart, medications, test results and follow-up plan with the patient.  This patient was seen by Leeanne Deed AGNP-C in Collaboration with Dr Lyndon Code as a part of collaborative care agreement     Lubertha Basque. Betzabeth Derringer AGNP-C Internal medicine

## 2020-11-14 ENCOUNTER — Encounter: Payer: Self-pay | Admitting: Hospice and Palliative Medicine

## 2020-11-26 ENCOUNTER — Encounter: Payer: Self-pay | Admitting: Hospice and Palliative Medicine

## 2020-11-26 ENCOUNTER — Ambulatory Visit: Payer: BC Managed Care – PPO | Admitting: Hospice and Palliative Medicine

## 2020-11-26 ENCOUNTER — Other Ambulatory Visit: Payer: Self-pay

## 2020-11-26 VITALS — BP 124/90 | HR 77 | Temp 97.3°F | Resp 16 | Ht 66.0 in | Wt 187.8 lb

## 2020-11-26 DIAGNOSIS — R519 Headache, unspecified: Secondary | ICD-10-CM

## 2020-11-26 DIAGNOSIS — G8929 Other chronic pain: Secondary | ICD-10-CM

## 2020-11-26 DIAGNOSIS — E119 Type 2 diabetes mellitus without complications: Secondary | ICD-10-CM

## 2020-11-26 DIAGNOSIS — M542 Cervicalgia: Secondary | ICD-10-CM | POA: Diagnosis not present

## 2020-11-26 DIAGNOSIS — R42 Dizziness and giddiness: Secondary | ICD-10-CM

## 2020-11-26 MED ORDER — DAPAGLIFLOZIN PROPANEDIOL 10 MG PO TABS: 10.0000 mg | ORAL_TABLET | Freq: Every day | ORAL | 3 refills | Status: DC

## 2020-11-26 MED ORDER — CYCLOBENZAPRINE HCL 10 MG PO TABS: 10.0000 mg | ORAL_TABLET | Freq: Three times a day (TID) | ORAL | 0 refills | Status: DC | PRN

## 2020-11-26 MED ORDER — MECLIZINE HCL 25 MG PO TABS: 25.0000 mg | ORAL_TABLET | Freq: Two times a day (BID) | ORAL | 0 refills | Status: DC | PRN

## 2020-11-26 NOTE — Progress Notes (Signed)
[facility] [address] [place], [place] [address]  Internal MEDICINE  Office Visit Note  Patient Name: Melanie Briggs  [id number]  [id number]  Date of Service: 11/28/2020  Chief Complaint  Patient presents with  . Follow-up    Discuss blood sugar, headaches, balance is off, paperwork, unable to schedule appt with psychiatrist   . Diabetes  . Depression  . Hypertension  . Quality Metric Gaps    Pap, colonoscopy     HPI Patient is here for routine follow-up Has been monitoring glucose levels at home--averaging 140-160 Tolerating Farxiga well  Awaiting appointment with neurology in April Continues to have dizziness related to daily headaches and neck pain Referral for physical therapy for neck pain fell through--will look into this Awaiting a call from Dr. Maryruth Bun for referral to psychiatry for ongoing depression and anxiety primarily related to the passing of her mother  Has tried to go back to work--only able to work a few hours a week, we have been working on [employer] and short term disability but awaiting specialist recommendation  Sleep has somewhat improved since last visit, not taking Trazodone frequently    Current Medication: Outpatient Encounter Medications as of 11/26/2020  Medication Sig  . cyclobenzaprine (FLEXERIL) 10 MG tablet Take 1 tablet (10 mg total) by mouth 3 (three) times daily as needed for muscle spasms.  . dapagliflozin propanediol (FARXIGA) 10 MG TABS tablet Take 1 tablet (10 mg total) by mouth daily before breakfast.  . meclizine (ANTIVERT) 25 MG tablet Take 1 tablet (25 mg total) by mouth 2 (two) times daily as needed for dizziness.  . Accu-Chek Softclix Lancets lancets Use to check blood sugars twice a day  E11.65  . acetaminophen (TYLENOL) 500 MG tablet Take 1,000 mg by mouth daily as needed for headache.   . albuterol (VENTOLIN HFA) 108 (90 Base) MCG/ACT inhaler Inhale 2 puffs into the lungs every 6 (six) hours as needed for wheezing or  shortness of breath.  Marland Kitchen amLODipine (NORVASC) 5 MG tablet Take 1 tablet (5 mg total) by mouth daily.  . diphenhydrAMINE (BENADRYL) 25 MG tablet Take 25 mg by mouth every 6 (six) hours as needed (allergic reaction).  Marland Kitchen escitalopram (LEXAPRO) 20 MG tablet Take 1 tablet (20 mg total) by mouth daily.  Marland Kitchen glucose blood (ACCU-CHEK GUIDE) test strip Use to check blood sugars twice a day  E11.65  . hydrochlorothiazide (HYDRODIURIL) 25 MG tablet Take 1 tablet (25 mg total) by mouth daily.  Marland Kitchen ibuprofen (ADVIL,MOTRIN) 600 MG tablet Take 1 tablet (600 mg total) by mouth every 8 (eight) hours as needed.  . lidocaine (XYLOCAINE) 2 % solution Use as directed 5 mLs in the mouth or throat every 6 (six) hours as needed for mouth pain.  . metoprolol succinate (TOPROL-XL) 50 MG 24 hr tablet Take 50 mg by mouth daily. Take with or immediately following a meal.  . naproxen sodium (ANAPROX) 220 MG tablet Take 220 mg by mouth daily as needed (pain).  . traZODone (DESYREL) 50 MG tablet Take 0.5-1 tablets (25-50 mg total) by mouth at bedtime as needed for sleep.  . [DISCONTINUED] dapagliflozin propanediol (FARXIGA) 5 MG TABS tablet Take 1 tablet (5 mg total) by mouth daily before breakfast.   No facility-administered encounter medications on file as of 11/26/2020.    Surgical History: Past Surgical History:  Procedure Laterality Date  . ABDOMINAL HYSTERECTOMY    . TUMOR EXCISION N/A     Medical History: Past Medical History:  Diagnosis Date  .  Bronchitis   . Depression   . Diabetes mellitus without complication (HCC)   . Hypertension   . Migraine     Family History: Family History  Problem Relation Age of Onset  . Diabetes Other   . Diabetes Mother   . Kidney failure Mother   . Pancreatitis Mother   . Hyperlipidemia Father     Social History   Socioeconomic History  . Marital status: Single    Spouse name: Not on file  . Number of children: Not on file  . Years of education: Not on file  .  Highest education level: Not on file  Occupational History  . Not on file  Tobacco Use  . Smoking status: Former Smoker    Years: 3.00    Types: Cigarettes  . Smokeless tobacco: Never Used  Substance and Sexual Activity  . Alcohol use: Yes    Comment: socailly  . Drug use: No  . Sexual activity: Not on file  Other Topics Concern  . Not on file  Social History Narrative  . Not on file   Social Determinants of Health   Financial Resource Strain: Not on file  Food Insecurity: Not on file  Transportation Needs: Not on file  Physical Activity: Not on file  Stress: Not on file  Social Connections: Not on file  Intimate Partner Violence: Not on file   Review of Systems  Constitutional: Positive for fatigue. Negative for chills and diaphoresis.  HENT: Negative for ear pain, postnasal drip and sinus pressure.   Eyes: Negative for photophobia, discharge, redness, itching and visual disturbance.  Respiratory: Negative for cough, shortness of breath and wheezing.   Cardiovascular: Negative for chest pain, palpitations and leg swelling.  Gastrointestinal: Negative for abdominal pain, constipation, diarrhea, nausea and vomiting.  Genitourinary: Negative for dysuria and flank pain.  Musculoskeletal: Positive for neck pain and neck stiffness. Negative for arthralgias, back pain and gait problem.  Skin: Negative for color change.  Allergic/Immunologic: Negative for environmental allergies and food allergies.  Neurological: Positive for dizziness and headaches.  Hematological: Does not bruise/bleed easily.  Psychiatric/Behavioral: Positive for behavioral problems (depression) and sleep disturbance. Negative for agitation and hallucinations. The patient is nervous/anxious.     Vital Signs: BP 124/90   Pulse 77   Temp (!) 97.3 F (36.3 C)   Resp 16   Ht 5\' 6"  (1.676 m)   Wt 187 lb 12.8 oz (85.2 kg)   SpO2 98%   BMI 30.31 kg/m    Physical Exam Vitals reviewed.  Constitutional:       Appearance: Normal appearance. She is normal weight.  Cardiovascular:     Rate and Rhythm: Normal rate and regular rhythm.     Pulses: Normal pulses.     Heart sounds: Normal heart sounds.  Pulmonary:     Effort: Pulmonary effort is normal.     Breath sounds: Normal breath sounds.  Abdominal:     General: Abdomen is flat.     Palpations: Abdomen is soft.  Musculoskeletal:        General: Normal range of motion.     Cervical back: Normal range of motion.  Skin:    General: Skin is warm.  Neurological:     General: No focal deficit present.     Mental Status: She is alert and oriented to person, place, and time. Mental status is at baseline.  Psychiatric:        Mood and Affect: Mood normal.  Behavior: Behavior normal.        Thought Content: Thought content normal.        Judgment: Judgment normal.    Assessment/Plan: 1. Type 2 diabetes mellitus without complication, without long-term current use of insulin (HCC) Glucose levels well controlled, increase dose of Farxiga - dapagliflozin propanediol (FARXIGA) 10 MG TABS tablet; Take 1 tablet (10 mg total) by mouth daily before breakfast.  Dispense: 30 tablet; Refill: 3  2. Chronic neck pain Try flexeril as needed Resent referral to PT as this has been helpful to her in the past - Ambulatory referral to Physical Therapy - cyclobenzaprine (FLEXERIL) 10 MG tablet; Take 1 tablet (10 mg total) by mouth 3 (three) times daily as needed for muscle spasms.  Dispense: 60 tablet; Refill: 0  3. Chronic nonintractable headache, unspecified headache type Awaiting referral with neurology, continue with present management  4. Dizziness, nonspecific Try Meclizine as needed to help with symptom control-advised to take in the evening as medication may cause drwosiness - meclizine (ANTIVERT) 25 MG tablet; Take 1 tablet (25 mg total) by mouth 2 (two) times daily as needed for dizziness.  Dispense: 30 tablet; Refill: 0  General  Counseling: Melanie Briggs verbalizes understanding of the findings of todays visit and agrees with plan of treatment. I have discussed any further diagnostic evaluation that may be needed or ordered today. We also reviewed her medications today. she has been encouraged to call the office with any questions or concerns that should arise related to todays visit.    Orders Placed This Encounter  Procedures  . Ambulatory referral to Physical Therapy    Meds ordered this encounter  Medications  . dapagliflozin propanediol (FARXIGA) 10 MG TABS tablet    Sig: Take 1 tablet (10 mg total) by mouth daily before breakfast.    Dispense:  30 tablet    Refill:  3  . meclizine (ANTIVERT) 25 MG tablet    Sig: Take 1 tablet (25 mg total) by mouth 2 (two) times daily as needed for dizziness.    Dispense:  30 tablet    Refill:  0  . cyclobenzaprine (FLEXERIL) 10 MG tablet    Sig: Take 1 tablet (10 mg total) by mouth 3 (three) times daily as needed for muscle spasms.    Dispense:  60 tablet    Refill:  0    Time spent: 30 Minutes Time spent includes review of chart, medications, test results and follow-up plan with the patient.  This patient was seen by Leeanne Deed AGNP-C in Collaboration with Dr Lyndon Code as a part of collaborative care agreement     Lubertha Basque. Kalissa Grays AGNP-C Internal medicine

## 2020-11-27 ENCOUNTER — Telehealth: Payer: Self-pay

## 2020-11-27 NOTE — Telephone Encounter (Signed)
Received disability paperwork for patient and placed in provider folder to be completed. [name]

## 2020-11-28 ENCOUNTER — Encounter: Payer: Self-pay | Admitting: Hospice and Palliative Medicine

## 2020-11-28 ENCOUNTER — Telehealth: Payer: Self-pay

## 2020-11-28 DIAGNOSIS — M542 Cervicalgia: Secondary | ICD-10-CM | POA: Diagnosis not present

## 2020-11-28 DIAGNOSIS — R519 Headache, unspecified: Secondary | ICD-10-CM | POA: Diagnosis not present

## 2020-11-28 DIAGNOSIS — R29898 Other symptoms and signs involving the musculoskeletal system: Secondary | ICD-10-CM | POA: Diagnosis not present

## 2020-11-28 DIAGNOSIS — R42 Dizziness and giddiness: Secondary | ICD-10-CM | POA: Diagnosis not present

## 2020-11-28 NOTE — Telephone Encounter (Signed)
Disability paperwork completed by provider. Copy placed in scan. Patient advised ready for pickup at front desk on 11-28-20. [name]

## 2020-12-01 ENCOUNTER — Other Ambulatory Visit: Payer: Self-pay | Admitting: Physician Assistant

## 2020-12-01 ENCOUNTER — Other Ambulatory Visit (HOSPITAL_COMMUNITY): Payer: Self-pay | Admitting: Physician Assistant

## 2020-12-01 DIAGNOSIS — R519 Headache, unspecified: Secondary | ICD-10-CM

## 2020-12-03 ENCOUNTER — Telehealth: Payer: Self-pay

## 2020-12-03 NOTE — Telephone Encounter (Signed)
Lmom to pick up disability paperwork at the front desk JS

## 2020-12-12 ENCOUNTER — Other Ambulatory Visit: Payer: Self-pay

## 2020-12-12 ENCOUNTER — Ambulatory Visit
Admission: RE | Admit: 2020-12-12 | Discharge: 2020-12-12 | Disposition: A | Discharge status: Home / Self Care | Payer: BC Managed Care – PPO | Source: Ambulatory Visit | Attending: Physician Assistant | Admitting: Physician Assistant

## 2020-12-12 DIAGNOSIS — R519 Headache, unspecified: Secondary | ICD-10-CM | POA: Diagnosis not present

## 2020-12-15 DIAGNOSIS — R29898 Other symptoms and signs involving the musculoskeletal system: Secondary | ICD-10-CM | POA: Diagnosis not present

## 2020-12-15 DIAGNOSIS — M542 Cervicalgia: Secondary | ICD-10-CM | POA: Diagnosis not present

## 2020-12-15 DIAGNOSIS — R42 Dizziness and giddiness: Secondary | ICD-10-CM | POA: Diagnosis not present

## 2020-12-15 DIAGNOSIS — R519 Headache, unspecified: Secondary | ICD-10-CM | POA: Diagnosis not present

## 2020-12-22 ENCOUNTER — Ambulatory Visit: Payer: BC Managed Care – PPO

## 2020-12-25 ENCOUNTER — Ambulatory Visit: Payer: BC Managed Care – PPO | Attending: Physician Assistant

## 2020-12-25 ENCOUNTER — Other Ambulatory Visit: Payer: Self-pay

## 2020-12-25 DIAGNOSIS — M542 Cervicalgia: Secondary | ICD-10-CM | POA: Insufficient documentation

## 2020-12-25 DIAGNOSIS — G4486 Cervicogenic headache: Secondary | ICD-10-CM | POA: Diagnosis not present

## 2020-12-25 DIAGNOSIS — R42 Dizziness and giddiness: Secondary | ICD-10-CM | POA: Insufficient documentation

## 2020-12-25 NOTE — Therapy (Signed)
[facility] PHYSICAL AND SPORTS MEDICINE [address], [place], [address] Phone: [phone]   Fax:  [phone]  Physical Therapy Evaluation  Patient Details  Name: Melanie Briggs MRN: [id number] Date of Birth: November 03, 1970 Referring Provider (PT): Nilda Calamity PA-C   Encounter Date: 12/25/2020   PT End of Session - 12/25/20 1818    Visit Number 1    Number of Visits 17    Date for PT Re-Evaluation 02/19/21    Authorization Type BCBS COMM PRO    Authorization Time Period 12/25/20-03/19/21    Activity Tolerance Patient limited by pain;Patient tolerated treatment well    Behavior During Therapy [facility] for tasks assessed/performed           Past Medical History:  Diagnosis Date  . Bronchitis   . Depression   . Diabetes mellitus without complication (HCC)   . Hypertension   . Migraine     Past Surgical History:  Procedure Laterality Date  . ABDOMINAL HYSTERECTOMY    . TUMOR EXCISION N/A     There were no vitals filed for this visit.    Subjective Assessment - 12/25/20 1806    Subjective Melanie Briggs is a 49yoF who comes to OPPT for exacerbation of chornic HA problem.    Pertinent History Melanie Briggs is a 49yoF who comes to [facility] OPPT for help with chronic persistent Rt headache associated with Rt neck pain in the suboccipital/upper trap distribution with more recent development of referral to the right dorsal forearm. Pt reports head/neck symptoms since successful resolution with combined PT and injections in 2018 at [facility], but returned as a daily phenomenon in January of 2021 around the time of the passing of her mother. Pt reports the involvement of her dorsal forearm pain is new to this episode. Pt has head/neck/arm symptoms daily, but has 1-2x weekly vertiginous dizziness episodes with associated spinning sensation and visual phenomenon that would often last for entire day, now managed to ~45 minutes with use of  meclezine therapy. Pt reports head/neck/arm symptoms are worse in morning, then improved hours into the day and thereafter. Pt is essentailly guarded and slow with ADL actiivty due to symptoms, but has clear worsening of pain with Rt cervical rotation, cervical extension, and Right cervical sidebending. Recent MRI unremarkable, MRI in 2018 for same problem also unremarkable.    Limitations Sitting;Reading    How long can you sit comfortably? ~4 hours for work    How long can you stand comfortably? WNL    How long can you walk comfortably? WNL    Diagnostic tests MRI    Patient Stated Goals return to full time at work    Currently in Pain? Yes    Pain Score --   not numerically rated; some intermittent grimace; worse with light palpation and ROM   Pain Location --   Frontal HA B, Right [place] distribution HA, Rt suboccipital pain, Rt upper trap area pain, Rt first rib area pain, Right lateral triceps pain, Right dorsal forearm pain   Pain Onset More than a month ago    Pain Frequency Constant    Aggravating Factors  head movement, ADL, sitting, readin    Pain Relieving Factors lying in supine              [facility] PT Assessment - 12/25/20 0001      Assessment   Referring Provider (PT) Nilda Calamity PA-C    Onset Date/Surgical Date --  Jan 2021   Hand Dominance Right    Prior Therapy [facility] PT 2018      Precautions   Precautions None      Balance Screen   Has the patient fallen in the past 6 months Yes    How many times? 2   both associated with dizziness lightheadedness   Has the patient had a decrease in activity level because of a fear of falling?  Yes    Is the patient reluctant to leave their home because of a fear of falling?  No      Home [employer] information centre manager residence    Living Arrangements Spouse/significant other    Available Help at Discharge Family    Type of Home House    Home Access Stairs to enter    Entrance Stairs-Number of Steps 5     Entrance Stairs-Rails Right    Home Layout One level    Home Equipment None      Prior Function   Level of Independence Independent with basic ADLs          EXAMINATION: -Posture in gait: elevated Right shoulder, slightly protracted Right shoulder, minimal arm movement in gait, normal LUE arm swing, guarded and minimal head movements throughout session. No signs of ataxia, no gross asymmetry of legs, normal stance width   -Oculomotor screening; consistent disruption of Right eye smooth pursuits to the left only. Verticle smooth pursuits more saccadic in general. Cervical A/ROM  Extension: 34 degrees; Left rotation: 50 degrees with Rt upper trap stretch; Right rotation 45 degrees c Rt neck pain increase; Lateral flexion ~22 degrees bilat, Right c pain deep to the 1st rib area.  Supine: significant symptoms improvement immediately moving to supine  (+) Radial Nerve tension test that correlates to symptoms of Right later triceps pain and Right dorsal forearm pain Pec Minor: taut bands and shortening, heavy tenderness to palpation;   Palpation:  Palpable spastic tissue in all cervical extensors, upper traps, scalenes, pec minor. Severe tenderness to Rt upper traps, and scalenes;  First Rib Mobility: Testing difficult due to severe scalenes pain, but rib mobilization no more painful, mobility WNL    Objective measurements completed on examination: See above findings.    Manual Therapy:  Sustained release to pec minor Right x60 seconds, with resolved RUE pain after 30 sec.           PT Education - 12/25/20 1818    Education Details vertigo is a symptom, but not a diagnosis in and of itself    Person(s) Educated Patient    Methods Explanation    Comprehension Verbalized understanding            PT Short Term Goals - 12/25/20 1825      PT SHORT TERM GOAL #1   Title After 4 weeks pt to report 2-3 HEP actiivties to help with symptoms management with flareups.    Time 4     Period Weeks    Status New    Target Date 01/22/21      PT SHORT TERM GOAL #2   Title After 4 weeks pt to report successful plan for safe walking program performance.    Baseline fearful of vertigo episode whilst walking, has been avoiding extensive walking    Time 4    Period Weeks    Status New    Target Date 01/22/21             PT Long Term  Goals - 12/25/20 1827      PT LONG TERM GOAL #1   Title After 8 weeks pt to show 8 point improvement on FOTO survery to reflect improved functional activity tolerance.    Time 8    Period Weeks    Status New    Target Date 02/19/21      PT LONG TERM GOAL #2   Title After 8 weeks demonstrate improved cervical ROM in rotation >65 degrees bilat without pain provocation.    Baseline ~45 degrees bilat with pain during Rt rotation    Time 8    Period Weeks    Status New    Target Date 02/19/21      PT LONG TERM GOAL #3   Title After 12 weeks pt to report ability to perform 8 hour workday without symptoms exacerbation pain>2 numbers on NPRS.    Baseline tolerating 4 hour work day at eval    Time 12    Period Weeks    Status New    Target Date 03/19/21      PT LONG TERM GOAL #4   Title After 12 weeks pt to report ability to perfrom full RUE use and available ROM for grooming, self-care, and hygiene without pain limtiation.    Baseline Unable to use RUE for hair management, pain and guarded with dressing self    Time 12    Period Weeks    Status New    Target Date 03/19/21                  Plan - 12/25/20 1819    Clinical Impression Statement Extensive time to collect detail on this very complicated case. Pt with clear signs of both central and peripheral phenomenon which are linked together in occurrence through patient report. Exam shows altered tissue quality and allodynia about the Right cervical musculature, HA complaints correlate to likely referral from spastic upper traps, potentially also cervicogenic from scalenes  as well. Pt has (+) radial nerve tension that correlates to RUE symptoms. No vertiginous symptoms this date, however eye screening shows diminished quality of smooth pursuits with increased saccadic movements in the Right eye only. Pt reports a few instances of Right ear tinnitus, but denies any noted loss of hearing. Complexity of the patients case warrants further assessment moving forward, as well as extensive collaboration with interdisciplinary team. Pt's impairments have resulted in decreased ability to tolerate sitting posture, decreased tolerated RUE use, decreased confidence in safety with mobility, increased incidence of falls, all of which have limited independence with safe community distance AMB for IADL performance, self care and grooming, and ability to partake in full time work. Pt will benefit from skilled PT intervention to address these deficits and impairments, in order to maximize tolerance, independence, and safety of participation in ADL, IADL, and work duties.      Personal Factors and Comorbidities Age;Education;Time since onset of injury/illness/exacerbation;Past/Current Experience    Examination-Activity Limitations Bathing;Bed Mobility;Carry;Dressing;Hygiene/Grooming;Lift;Reach Overhead;Self Feeding    Examination-Participation Restrictions Meal Prep;Cleaning;Occupation;Driving;Community Activity;Interpersonal Relationship;Laundry;Yard Work;Shop    Stability/Clinical Decision Making Unstable/Unpredictable    Clinical Decision Making High    Rehab Potential Good    PT Frequency 2x / week    PT Duration 12 weeks    PT Treatment/Interventions ADLs/Self Care Home Management;Cryotherapy;Electrical Stimulation;Moist Heat;DME Instruction;Therapeutic exercise;Balance training;Neuromuscular re-education;Therapeutic activities;Functional mobility training;Patient/family education;Manual techniques;Passive range of motion;Dry needling;Vestibular    PT Next Visit Plan FOTO Survery, MMT and  sensation BUE;    PT  Home Exercise Plan to be determinado    Consulted and Agree with Plan of Care Patient           Patient will benefit from skilled therapeutic intervention in order to improve the following deficits and impairments:  Abnormal gait,Decreased activity tolerance,Decreased balance,Decreased range of motion,Decreased strength,Hypomobility,Dizziness,Increased muscle spasms,Impaired flexibility,Postural dysfunction  Visit Diagnosis: Cervicalgia  Cervicogenic headache  Dizziness and giddiness     Problem List There are no problems to display for this patient.  6:52 PM, 12/25/20 Melanie Briggs, PT, DPT Physical Therapist - [place] [phone] (Office)    Melanie Briggs C 12/25/2020, 6:33 PM  [place] PHYSICAL AND SPORTS MEDICINE [address], [place], [address] Phone: [phone]   Fax:  [phone]  Name: Melanie Briggs MRN: [id number] Date of Birth: 25-Nov-1970

## 2020-12-29 ENCOUNTER — Other Ambulatory Visit: Payer: Self-pay

## 2020-12-29 ENCOUNTER — Ambulatory Visit: Payer: BC Managed Care – PPO

## 2020-12-29 DIAGNOSIS — G4486 Cervicogenic headache: Secondary | ICD-10-CM | POA: Diagnosis not present

## 2020-12-29 DIAGNOSIS — M542 Cervicalgia: Secondary | ICD-10-CM

## 2020-12-29 DIAGNOSIS — R42 Dizziness and giddiness: Secondary | ICD-10-CM | POA: Diagnosis not present

## 2020-12-29 NOTE — Therapy (Signed)
[facility]IC[facility] [address] [place], [address] Phone: [phone]   Fax:  [phone]  Physical Therapy Treatment  Patient Details  Name: Melanie Briggs MRN: [id number] Date of Birth: [birth year] Referring Provider (PT): Nilda Calamity PA-C   Encounter Date: 12/29/2020   PT End of Session - 12/29/20 1742    Visit Number 2    Number of Visits 17    Date for PT Re-Evaluation 02/19/21    Authorization Type BCBS COMM PRO    Authorization Time Period 12/25/20-03/19/21    PT Start Time 1645    PT Stop Time 1734    PT Time Calculation (min) 49 min    Activity Tolerance Patient tolerated treatment well    Behavior During Therapy [facility] for tasks assessed/performed           Past Medical History:  Diagnosis Date  . Bronchitis   . Depression   . Diabetes mellitus without complication (HCC)   . Hypertension   . Migraine     Past Surgical History:  Procedure Laterality Date  . ABDOMINAL HYSTERECTOMY    . TUMOR EXCISION N/A     There were no vitals filed for this visit.   Subjective Assessment - 12/29/20 1740    Subjective Pt presents to PT with 9/10 pain in headache on R and L forehead that wraps aorund her head to her neck. Pt reports it was 15/10 and pt needed to take a Flexeril to get to 9/10. Weakness present with gripping where pt reports haivng difficulty grasping round door knobs and turning the knob. Pain reported with supination.    Pertinent History Melanie Briggs is a 49yoF who comes to [facility] for help with chronic persistent Rt headache associated with Rt neck pain in the suboccipital/upper trap distribution with more recent development of referral to the right dorsal forearm. Pt reports head/neck symptoms since successful resolution with combined PT and injections in 2018 at [facility], but returned as a daily phenomenon in January of 2021 around the time of the passing of her mother. Pt reports  the involvement of her dorsal forearm pain is new to this episode. Pt has head/neck/arm symptoms daily, but has 1-2x weekly vertiginous dizziness episodes with associated spinning sensation and visual phenomenon that would often last for entire day, now managed to ~45 minutes with use of meclezine therapy. Pt reports head/neck/arm symptoms are worse in morning, then improved hours into the day and thereafter. Pt is essentailly guarded and slow with ADL actiivty due to symptoms, but has clear worsening of pain with Rt cervical rotation, cervical extension, and Right cervical sidebending. Recent MRI unremarkable, MRI in 2018 for same problem also unremarkable.    Limitations Sitting;Reading    How long can you sit comfortably? ~4 hours for work    How long can you stand comfortably? WNL    How long can you walk comfortably? WNL    Diagnostic tests MRI    Patient Stated Goals return to full time at work    Currently in Pain? Yes    Pain Score 9     Pain Location Head    Pain Orientation Right;Left    Pain Descriptors / Indicators Aching    Pain Type Chronic pain    Pain Onset More than a month ago          Assessment of Grip strength and BUE sensation. Full sensation present in BUE's from C2-T2 to light  touch. Significant weakness in R grip vs L grip. Will measure with hand held dynamometer next session. Pain with active supination, gripping, and wrist extension.    Manual therapy:   Supine: Cervical distraction, 3x1 min with reports of instant pain relief in head and neck from 9 to 7.5/10.   Supine R UT stretch to pt tolerance: 3x30 sec   Prone PA grade 3 mobs at C2-C5 for improved motion. 1x30sec. Pt reports pain at C3-C5 described as burning so discontinued.   Prone UPA on L C2-T5 with grade 3 mobs to improve cervical motion. Reports L sided head aches reduced to 4/10 NPS. Pain with R sided UPA's so not continued.   5 min STM to R/L sided rhomboids and paraspinals for pain modulation. Pt  educated on use of tennis ball on wall for self mobilization.   Passive RUE resting onto head to improve R radial nerve pain. x1 min with reports of relief.  PT educated pt on different techniques and use of significant other at home to manage pain    There.ex:   Supine cervical retractions into towel: 2x15 reps.  Seated L UT stretch: 2x30 sec. VC for form/technique   Seated cervical SNAGS for extension. Required only use of LUE due to RUE grip impairment. x10   Seated cervical retraction x10. Pt reports increased pain in seated vs supine so discontinued.   Standing Radial nerve glide. With shoulder depression and wrist flexion pt had instant increase in R radial nerve pain. X2. Discontinued.   PT Education - 12/29/20 1742    Education Details Developed HEP. At home exercises to perform for pain relief.    Person(s) Educated Patient    Methods Explanation;Demonstration;Tactile cues;Verbal cues    Comprehension Verbalized understanding;Returned demonstration;Need further instruction            PT Short Term Goals - 12/25/20 1825      PT SHORT TERM GOAL #1   Title After 4 weeks pt to report 2-3 HEP actiivties to help with symptoms management with flareups.    Time 4    Period Weeks    Status New    Target Date 01/22/21      PT SHORT TERM GOAL #2   Title After 4 weeks pt to report successful plan for safe walking program performance.    Baseline fearful of vertigo episode whilst walking, has been avoiding extensive walking    Time 4    Period Weeks    Status New    Target Date 01/22/21             PT Long Term Goals - 12/25/20 1827      PT LONG TERM GOAL #1   Title After 8 weeks pt to show 8 point improvement on FOTO survery to reflect improved functional activity tolerance.    Time 8    Period Weeks    Status New    Target Date 02/19/21      PT LONG TERM GOAL #2   Title After 8 weeks demonstrate improved cervical ROM in rotation >65 degrees bilat without pain  provocation.    Baseline ~45 degrees bilat with pain during Rt rotation    Time 8    Period Weeks    Status New    Target Date 02/19/21      PT LONG TERM GOAL #3   Title After 12 weeks pt to report ability to perform 8 hour workday without symptoms exacerbation pain>2 numbers on NPRS.  Baseline tolerating 4 hour work day at eval    Time 12    Period Weeks    Status New    Target Date 03/19/21      PT LONG TERM GOAL #4   Title After 12 weeks pt to report ability to perfrom full RUE use and available ROM for grooming, self-care, and hygiene without pain limtiation.    Baseline Unable to use RUE for hair management, pain and guarded with dressing self    Time 12    Period Weeks    Status New    Target Date 03/19/21                 Plan - 12/29/20 1743    Clinical Impression Statement Pt responded well to manual therapy technique and educated on tehcniques pt can perform at home to manage headaches and cervical pain. Pt went from 9/10 NPS in R/L frontal headache to 7.5/10 after manual therapy on R and 4/10 NPS on L. No influence of RUE pain from manual therapy or therex today. R post and dorsal pain along RUE relieved with passive placement of RUE on top of head. Significant grip strength change with finger squeeze on R vs L and plan on measuring with hand held dynamometry next session for objective measure of strength impairment from cervical radiculopathy and pain with active supination. Pt thankful for relief from PT session and maintains motivatoin to improve in therapy with HEP. Pt can benefit from skilled PT services to improve pain, strength, and AROM so pt can return to PLOF.    Personal Factors and Comorbidities Age;Education;Time since onset of injury/illness/exacerbation;Past/Current Experience    Examination-Activity Limitations Bathing;Bed Mobility;Carry;Dressing;Hygiene/Grooming;Lift;Reach Overhead;Self Feeding    Examination-Participation Restrictions Meal  Prep;Cleaning;Occupation;Driving;Community Activity;Interpersonal Relationship;Laundry;Yard Work;Shop    Stability/Clinical Decision Making Unstable/Unpredictable    Rehab Potential Good    PT Frequency 2x / week    PT Duration 12 weeks    PT Treatment/Interventions ADLs/Self Care Home Management;Cryotherapy;Electrical Stimulation;Moist Heat;DME Instruction;Therapeutic exercise;Balance training;Neuromuscular re-education;Therapeutic activities;Functional mobility training;Patient/family education;Manual techniques;Passive range of motion;Dry needling;Vestibular    PT Next Visit Plan FOTO Survey;    PT Home Exercise Plan R UT stretch, supine cervical retractions, STM with tennis ball on wall on rhomboids and paraspinals.    Consulted and Agree with Plan of Care Patient           Patient will benefit from skilled therapeutic intervention in order to improve the following deficits and impairments:  Abnormal gait,Decreased activity tolerance,Decreased balance,Decreased range of motion,Decreased strength,Hypomobility,Dizziness,Increased muscle spasms,Impaired flexibility,Postural dysfunction  Visit Diagnosis: Cervicalgia  Cervicogenic headache     Problem List There are no problems to display for this patient.   Delphia Grates. Fairly IV, PT, DPT Physical Therapist- [place]  [facility]  12/29/2020, 6:00 PM  [place] [facility] PHYSICAL AND SPORTS MEDICINE [address] [place], [address] Phone: [phone]   Fax:  [phone]  Name: Melanie Briggs MRN: [id number] Date of Birth: [birth year]

## 2020-12-31 ENCOUNTER — Ambulatory Visit: Payer: BC Managed Care – PPO

## 2020-12-31 ENCOUNTER — Other Ambulatory Visit: Payer: Self-pay

## 2020-12-31 DIAGNOSIS — R42 Dizziness and giddiness: Secondary | ICD-10-CM | POA: Diagnosis not present

## 2020-12-31 DIAGNOSIS — M542 Cervicalgia: Secondary | ICD-10-CM

## 2020-12-31 DIAGNOSIS — G4486 Cervicogenic headache: Secondary | ICD-10-CM

## 2021-01-01 NOTE — Therapy (Addendum)
[place] [facility]ICINE [address] [place], [address] Phone: [phone]   Fax:  [phone]  Physical Therapy Treatment  Patient Details  Name: Leiana Rund MRN: [id number] Date of Birth: 21-May-1971 Referring Provider (PT): Nilda Calamity PA-C   Encounter Date: 12/31/2020   PT End of Session - 01/01/21 0728    Visit Number 3    Number of Visits 17    Date for PT Re-Evaluation 02/19/21    Authorization Type BCBS COMM PRO    Authorization Time Period 12/25/20-03/19/21    PT Start Time 1644    PT Stop Time 1728    PT Time Calculation (min) 44 min    Activity Tolerance Patient tolerated treatment well    Behavior During Therapy [facility] for tasks assessed/performed           Past Medical History:  Diagnosis Date  . Bronchitis   . Depression   . Diabetes mellitus without complication (HCC)   . Hypertension   . Migraine     Past Surgical History:  Procedure Laterality Date  . ABDOMINAL HYSTERECTOMY    . TUMOR EXCISION N/A     There were no vitals filed for this visit.   Subjective Assessment - 01/01/21 0724    Subjective Pt reports improved symptoms on L side of neck and L sided head ache at 5/10 NPS but R sided head ache/neck pain and radicular RUE pain at 9/10 NPS. Pt has had no relief with cervical retractions from HEP. Pt arrives with significant other for PT to educate on how to perform gentle, manual cervical traction and STM techniques to reduce neck and back spasms.    Pertinent History Danilyn Cocke is a 49yoF who comes to [facility] for help with chronic persistent Rt headache associated with Rt neck pain in the suboccipital/upper trap distribution with more recent development of referral to the right dorsal forearm. Pt reports head/neck symptoms since successful resolution with combined PT and injections in 2018 at [facility], but returned as a daily phenomenon in January of 2021 around the time of the  passing of her mother. Pt reports the involvement of her dorsal forearm pain is new to this episode. Pt has head/neck/arm symptoms daily, but has 1-2x weekly vertiginous dizziness episodes with associated spinning sensation and visual phenomenon that would often last for entire day, now managed to ~45 minutes with use of meclezine therapy. Pt reports head/neck/arm symptoms are worse in morning, then improved hours into the day and thereafter. Pt is essentailly guarded and slow with ADL actiivty due to symptoms, but has clear worsening of pain with Rt cervical rotation, cervical extension, and Right cervical sidebending. Recent MRI unremarkable, MRI in 2018 for same problem also unremarkable.    Limitations Sitting;Reading    How long can you sit comfortably? ~4 hours for work    How long can you stand comfortably? WNL    How long can you walk comfortably? WNL    Diagnostic tests MRI    Patient Stated Goals return to full time at work    Currently in Pain? Yes    Pain Score 9     Pain Location --   R side of Head, neck, and RUE   Pain Onset More than a month ago           Manual Therapy:   Pt in supine   Manual cervical traction: 5x1 min bouts   Pt's significant other educated  on how to perform for at home pain relief. PT demo and close supervision as significant other performed 2x1 min bouts. VC's and TC's for appropriate hand placement with excellent carryover. Pt reports pain relief with significant other performing. No safety concerns with significant other preforming at home for pt. Educated on eliminating at home manual traction technique if increase of symptoms occurs with pt and significant other verbalizing understanding. 6/10 NPS recorded in R neck and head but no changes in RUE radicular/referred symptoms.  Pt in prone with slight cervical flexion   STM for 10 min to R/L rhomboids, R levator scap, and B thoracic paraspinals. R levator scap palpation reproduces R sided neck and head  ache pain. Additional scap retractions while PT performed soft tissue release with reports of further improved headache reductions and reduced cervical pain to 5/10 NPS and reports of reduction of RUE pain.     There.ex:   Prone shoulder depression: 2x10 reps. Initial, concordant pain in RUE. As pt continues performance, pt reports reduction in RUE pain to 5/10 NPS.   Prone scap retractions: 2x10. VC's for decreasing speed and improving quality of motion.  Seated scap retractions: x10. PT demo and TC/VC's for initial form/technique and reduction of B UT compensation. Good form/technique after education.   Seated, R levator scap stretch: 3x30 sec. Good form/technique.   Standing shoulder depression: x5. VC' and PT demo for form/technique. Increased pain compared prone shoulder depression. PT educated pt to perform in supine or prone position for now.    Post test of assessing grip strength with finger squeeze test. Due to noted subjective improvement from previous session, and reduction of RUE pain with gripping, use of hand held dynamometer  R: 10 Kg   L: 79-80 Kg   Further post test of turning door knob performed. Pt displays ability to grip door knob and actively open a closed door which pt reports she has not been able to perform since prior to PT.   Pt reports significant reduction in pain globally on R side from 9/10 NPS to 5/10 NPS and improved supination on R forearm and strength production with ability to open a closed door. New strength goal written to objectively track B grip strength going forward.    PT Education - 01/01/21 0727    Education Details Pt progressed HEP. Pt's significant other educated on gentle, manual cervical traction technique to reduce pt's symptoms. Excellent job with safety displayed and indep. Pt does record pain relief with spouse performance.    Person(s) Educated Patient;Spouse    Methods Explanation;Demonstration;Tactile cues;Verbal cues;Handout     Comprehension Verbalized understanding;Returned demonstration            PT Short Term Goals - 12/25/20 1825      PT SHORT TERM GOAL #1   Title After 4 weeks pt to report 2-3 HEP actiivties to help with symptoms management with flareups.    Time 4    Period Weeks    Status New    Target Date 01/22/21      PT SHORT TERM GOAL #2   Title After 4 weeks pt to report successful plan for safe walking program performance.    Baseline fearful of vertigo episode whilst walking, has been avoiding extensive walking    Time 4    Period Weeks    Status New    Target Date 01/22/21             PT Long Term Goals - 01/01/21 3614  PT LONG TERM GOAL #1   Title After 8 weeks pt to show 8 point improvement on FOTO survery to reflect improved functional activity tolerance.    Time 8    Period Weeks    Status New      PT LONG TERM GOAL #2   Title After 8 weeks demonstrate improved cervical ROM in rotation >65 degrees bilat without pain provocation.    Baseline ~45 degrees bilat with pain during Rt rotation    Time 8    Period Weeks    Status New      PT LONG TERM GOAL #3   Title After 12 weeks pt to report ability to perform 8 hour workday without symptoms exacerbation pain>2 numbers on NPRS.    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    Baseline Unable to use RUE for hair management, pain and guarded with dressing self    Time 12    Period Weeks    Status New      PT LONG TERM GOAL #5   Title Pt will display R grip strength equal to LUE to demonstrate clinically significant improvment from cervical radicular symptoms to perform basic ADL's such as opening doors, cans, turning steering wheel.    Baseline L: 79-80 Kg, R: 10 Kg    Time 12    Period Weeks     Status New    Target Date 03/19/21                 Plan - 01/01/21 0729    Clinical Impression Statement With utilization of further STM, manual therapy, and exercise pt has had improved symptom reduction. Pt recorded 9/10 pain NPS on RUE (dorsal forearm), R sided headache and neck pain and 5/10 NPS on L sided headache. After manual therapy and there.ex, pt reports a 5/10 pain on RUE, R side headache, and neck pain. Pt displays ability to actively supinate R forearm with reports of significant pain reduction and able to grasp door knob and have enough strength to open door which pt has not been able to perform prior to PT. Significant other educated on perfromance of gentle, manual cervical retraction to give pt pain relief at home. PT demo and close supervision as significant other performed on pt. Good form/technique after PT education with pt reports symptom reduction and feels similar to how PT performed. Pt and significant other educated to eliminate at home manual cervical traction if increased symptom provocation occurs. Both verbalized understanding. Pt can further benefit from skilled PT services to reduce pain and further improve strength and mobility of cervical spine and RUE so pt can return to PLOF.    Personal Factors and Comorbidities Age;Education;Time since onset of injury/illness/exacerbation;Past/Current Experience    Examination-Activity Limitations Bathing;Bed Mobility;Carry;Dressing;Hygiene/Grooming;Lift;Reach Overhead;Self Feeding    Examination-Participation Restrictions Meal Prep;Cleaning;Occupation;Driving;Community Activity;Interpersonal Relationship;Laundry;Yard Work;Shop    Stability/Clinical Decision Making Unstable/Unpredictable    Rehab Potential Good    PT Frequency 2x / week    PT Duration 12 weeks    PT Treatment/Interventions ADLs/Self Care Home Management;Cryotherapy;Electrical Stimulation;Moist Heat;DME Instruction;Therapeutic exercise;Balance  training;Neuromuscular re-education;Therapeutic activities;Functional mobility training;Patient/family education;Manual techniques;Passive range of motion;Dry needling;Vestibular    PT Next Visit  Plan FOTO Survey;    PT Home Exercise Plan R UT stretch, supine cervical retractions, STM with tennis ball on wall on rhomboids and paraspinals. Levator scap stretch, scap retractions.    Consulted and Agree with Plan of Care Patient           Patient will benefit from skilled therapeutic intervention in order to improve the following deficits and impairments:  Abnormal gait,Decreased activity tolerance,Decreased balance,Decreased range of motion,Decreased strength,Hypomobility,Dizziness,Increased muscle spasms,Impaired flexibility,Postural dysfunction  Visit Diagnosis: Cervicalgia  Cervicogenic headache     Problem List There are no problems to display for this patient.   Delphia GratesMilton M. Fairly IV, PT, DPT Physical Therapist- [place]  [facility]  01/01/2021, 8:00 AM  [place] [facility] [address] [place], [place], [address] Phone: [phone]   Fax:  [phone]  Name: Lawrence SantiagoLadonna Madry MRN: [id number] Date of Birth: [birth year]

## 2021-01-05 ENCOUNTER — Ambulatory Visit: Payer: BC Managed Care – PPO

## 2021-01-05 ENCOUNTER — Other Ambulatory Visit: Payer: Self-pay

## 2021-01-05 DIAGNOSIS — M542 Cervicalgia: Secondary | ICD-10-CM | POA: Diagnosis not present

## 2021-01-05 DIAGNOSIS — R42 Dizziness and giddiness: Secondary | ICD-10-CM | POA: Diagnosis not present

## 2021-01-05 DIAGNOSIS — G4486 Cervicogenic headache: Secondary | ICD-10-CM | POA: Diagnosis not present

## 2021-01-05 NOTE — Therapy (Signed)
[place] [facility] PHYSICAL AND SPORTS MEDICINE [address] [place], [address] Phone: [phone]   Fax:  [phone]  Physical Therapy Treatment  Patient Details  Name: Melanie Briggs MRN: [id number] Date of Birth: 02-05-1971 Referring Provider (PT): Nilda Calamity PA-C   Encounter Date: 01/05/2021   PT End of Session - 01/05/21 1747    Visit Number 4    Number of Visits 17    Date for PT Re-Evaluation 02/19/21    Authorization Type [employer] COMM PRO    Authorization Time Period 12/25/20-03/19/21    PT Start Time 1645    PT Stop Time 1733    PT Time Calculation (min) 48 min    Activity Tolerance Patient tolerated treatment well    Behavior During Therapy [facility] for tasks assessed/performed           Past Medical History:  Diagnosis Date  . Bronchitis   . Depression   . Diabetes mellitus without complication (HCC)   . Hypertension   . Migraine     Past Surgical History:  Procedure Laterality Date  . ABDOMINAL HYSTERECTOMY    . TUMOR EXCISION N/A     There were no vitals filed for this visit.   Subjective Assessment - 01/05/21 1744    Subjective Pt reports significant stress from work environment with current medical conditions limiting her ability to work full time. Pt states work is requiring medical paperwork to verify pt can only work 4 hours due to her head aches, cervical and RUE pain which has increased her pain. HEP has not been helpful during this flare up. Pt current pain in neck, RUE, and R side of head is 12/10 NPS prior to treatment.    Pertinent History Melanie Briggs is a 49yoF who comes to [facility] for help with chronic persistent Rt headache associated with Rt neck pain in the suboccipital/upper trap distribution with more recent development of referral to the right dorsal forearm. Pt reports head/neck symptoms since successful resolution with combined PT and injections in 2018 at [facility], but returned as a daily  phenomenon in January of 2021 around the time of the passing of her mother. Pt reports the involvement of her dorsal forearm pain is new to this episode. Pt has head/neck/arm symptoms daily, but has 1-2x weekly vertiginous dizziness episodes with associated spinning sensation and visual phenomenon that would often last for entire day, now managed to ~45 minutes with use of meclezine therapy. Pt reports head/neck/arm symptoms are worse in morning, then improved hours into the day and thereafter. Pt is essentailly guarded and slow with ADL actiivty due to symptoms, but has clear worsening of pain with Rt cervical rotation, cervical extension, and Right cervical sidebending. Recent MRI unremarkable, MRI in 2018 for same problem also unremarkable.    Limitations Sitting;Reading    How long can you sit comfortably? ~4 hours for work    How long can you stand comfortably? WNL    How long can you walk comfortably? WNL    Diagnostic tests MRI    Patient Stated Goals return to full time at work    Currently in Pain? Yes    Pain Score --   12/10   Pain Location Neck    Pain Orientation Right;Left    Pain Descriptors / Indicators Aching    Pain Type Chronic pain    Pain Onset More than a month ago          Manual  Therapy:   Pt in supine:    Cervical manual traction to alleviate head ache and RUE radicular symptoms: 5x1 min    Suboccipital release to improve tension head ache pain: 3 min   R upper trap stretch and levator scap stretch: 3x30sec/muscle    Pt in prone:    STM for 10 min along R/L rhomboids/middle trap. Added MWM with active scap retractions with STM to reduce referred shoulder/neck/RUE pain.     GHJ depression MWM with STM to lower trap on R side. 3 min    Pt in L side lying:    Scapulothoracic mobs for RUE/shoulder pain modulation and radial nerve mobility. Upward/downward rotation, retraction/protraction, elevation, depression. For 8 min.    Pt reports significant reduction in  pain in her headaches, RUE, and neck. Prior to treatment pt reported her pain at 12/10. Pt reports pain free on L side of her head and neck. 7.5/10 NPS reported in R head ache, RUE, and shoulder.   PT Education - 01/05/21 1747    Education Details form/technique with exercise. utilization of stress relieving techniques to improve her pain levels.    Person(s) Educated Patient    Methods Explanation    Comprehension Verbalized understanding            PT Short Term Goals - 12/25/20 1825      PT SHORT TERM GOAL #1   Title After 4 weeks pt to report 2-3 HEP actiivties to help with symptoms management with flareups.    Time 4    Period Weeks    Status New    Target Date 01/22/21      PT SHORT TERM GOAL #2   Title After 4 weeks pt to report successful plan for safe walking program performance.    Baseline fearful of vertigo episode whilst walking, has been avoiding extensive walking    Time 4    Period Weeks    Status New    Target Date 01/22/21             PT Long Term Goals - 01/01/21 0737      PT LONG TERM GOAL #1   Title After 8 weeks pt to show 8 point improvement on FOTO survery to reflect improved functional activity tolerance.    Time 8    Period Weeks    Status New      PT LONG TERM GOAL #2   Title After 8 weeks demonstrate improved cervical ROM in rotation >65 degrees bilat without pain provocation.    Baseline ~45 degrees bilat with pain during Rt rotation    Time 8    Period Weeks    Status New      PT LONG TERM GOAL #3   Title After 12 weeks pt to report ability to perform 8 hour workday without symptoms exacerbation pain>2 numbers on NPRS.    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    Baseline Unable to use RUE for hair management,  pain and guarded with dressing self    Time 12    Period Weeks    Status New      PT LONG TERM GOAL #5  Title Pt will display R grip strength equal to LUE to demonstrate clinically significant improvment from cervical radicular symptoms to perform basic ADL's such as opening doors, cans, turning steering wheel.    Baseline L: 79-80 Kg, R: 10 Kg    Time 12    Period Weeks    Status New    Target Date 03/19/21                 Plan - 01/05/21 1748    Clinical Impression Statement First fifteen minutes of session pt discussion with PT on current stress levels and work situation which has caused significant additional pain to her head, neck, and RUE. Focus of session on utilization of manual techniques to reduce pain to managable level. Pt went from "12/10" NPS to no pain on L side of head/LUE, 7.5/10 NPS in RUE, neck, and head ache. Pt educated on utilizing stress relieving techniques to assist in pain modulation. Despite recent flare up pt still displays ability to turn door knob with R hand to open/close a door which is still an improvement in function since initial evaluation. Pt can benefit from further skilled PT services to address pain, mobility, and strength so pt can return to PLOF and work full time with managable pain levels.    Personal Factors and Comorbidities Age;Education;Time since onset of injury/illness/exacerbation;Past/Current Experience    Examination-Activity Limitations Bathing;Bed Mobility;Carry;Dressing;Hygiene/Grooming;Lift;Reach Overhead;Self Feeding    Examination-Participation Restrictions Meal Prep;Cleaning;Occupation;Driving;Community Activity;Interpersonal Relationship;Laundry;Yard Work;Shop    Stability/Clinical Decision Making Unstable/Unpredictable    Clinical Decision Making High    Rehab Potential Good    PT Frequency 2x / week    PT Duration 12 weeks    PT Treatment/Interventions ADLs/Self Care Home Management;Cryotherapy;Electrical  Stimulation;Moist Heat;DME Instruction;Therapeutic exercise;Balance training;Neuromuscular re-education;Therapeutic activities;Functional mobility training;Patient/family education;Manual techniques;Passive range of motion;Dry needling;Vestibular    PT Next Visit Plan FOTO Survey;    PT Home Exercise Plan R UT stretch, supine cervical retractions, STM with tennis ball on wall on rhomboids and paraspinals. Levator scap stretch, scap retractions.    Consulted and Agree with Plan of Care Patient           Patient will benefit from skilled therapeutic intervention in order to improve the following deficits and impairments:  Abnormal gait,Decreased activity tolerance,Decreased balance,Decreased range of motion,Decreased strength,Hypomobility,Dizziness,Increased muscle spasms,Impaired flexibility,Postural dysfunction  Visit Diagnosis: Cervicalgia     Problem List There are no problems to display for this patient.   Delphia Grates. Fairly IV, PT, DPT Physical Therapist- [place]  [facility]  01/05/2021, 5:53 PM  [facility] PHYSICAL AND SPORTS MEDICINE [address] [place], [address] Phone: [phone]   Fax:  [phone]  Name: Melanie Briggs MRN: [id number] Date of Birth: Oct 16, 1970

## 2021-01-07 ENCOUNTER — Other Ambulatory Visit: Payer: Self-pay

## 2021-01-07 ENCOUNTER — Encounter: Payer: Self-pay | Admitting: Hospice and Palliative Medicine

## 2021-01-07 ENCOUNTER — Ambulatory Visit (INDEPENDENT_AMBULATORY_CARE_PROVIDER_SITE_OTHER): Payer: BC Managed Care – PPO | Admitting: Hospice and Palliative Medicine

## 2021-01-07 ENCOUNTER — Ambulatory Visit: Payer: BC Managed Care – PPO

## 2021-01-07 VITALS — BP 106/87 | HR 70 | Temp 97.5°F | Resp 16 | Ht 66.0 in | Wt 191.4 lb

## 2021-01-07 DIAGNOSIS — F331 Major depressive disorder, recurrent, moderate: Secondary | ICD-10-CM

## 2021-01-07 DIAGNOSIS — G4486 Cervicogenic headache: Secondary | ICD-10-CM

## 2021-01-07 DIAGNOSIS — M542 Cervicalgia: Secondary | ICD-10-CM

## 2021-01-07 DIAGNOSIS — R519 Headache, unspecified: Secondary | ICD-10-CM | POA: Diagnosis not present

## 2021-01-07 DIAGNOSIS — G8929 Other chronic pain: Secondary | ICD-10-CM

## 2021-01-07 DIAGNOSIS — E119 Type 2 diabetes mellitus without complications: Secondary | ICD-10-CM

## 2021-01-07 DIAGNOSIS — R42 Dizziness and giddiness: Secondary | ICD-10-CM | POA: Diagnosis not present

## 2021-01-07 NOTE — Progress Notes (Signed)
[facility] [address] [place], [place] [address]  Internal MEDICINE  Office Visit Note  Patient Name: Melanie Briggs  [id number]  [id number]  Date of Service: 01/14/2021  Chief Complaint  Patient presents with  . Follow-up    Sever headache, neck, right shoulder, right arm and right hand pain, balance is off, update paperwork, while at work yesterday pain was so bad in hand that her hand was throbbing and shaking while using a mouse   . Depression  . Diabetes  . Hypertension  . Quality Metric Gaps    Pap, colonoscopy    HPI Patient is here for routine follow-up Has started PT ordered by neurology, was also started on Effexor--was not able to tolerate Also unable to tolerate Lexapro in the past as this caused GI upset Scheduled to see psychiatrist in June Currently working half days--approved until 4/30, scheduled to see neurologist again 5/2--wanting to discuss with them taking a short leave off from work Explained that neurologist or psychiatrist will need to work on evaluation and determine her need to take short term leave Continues to have daily headaches and neck pain that radiates down to her right arm and hand Has noticed some improvement in her pain since starting physical therapy  Continues to tolerate [place] well without negative side effects   Current Medication: Outpatient Encounter Medications as of 01/07/2021  Medication Sig  . Accu-Chek Softclix Lancets lancets Use to check blood sugars twice a day  E11.65  . acetaminophen (TYLENOL) 500 MG tablet Take 1,000 mg by mouth daily as needed for headache.   . albuterol (VENTOLIN HFA) 108 (90 Base) MCG/ACT inhaler Inhale 2 puffs into the lungs every 6 (six) hours as needed for wheezing or shortness of breath.  Marland Kitchen amLODipine (NORVASC) 5 MG tablet Take 1 tablet (5 mg total) by mouth daily.  . cyclobenzaprine (FLEXERIL) 10 MG tablet Take 1 tablet (10 mg total) by mouth 3 (three) times daily as needed for  muscle spasms.  . dapagliflozin propanediol (FARXIGA) 10 MG TABS tablet Take 1 tablet (10 mg total) by mouth daily before breakfast.  . diphenhydrAMINE (BENADRYL) 25 MG tablet Take 25 mg by mouth every 6 (six) hours as needed (allergic reaction).  Marland Kitchen glucose blood (ACCU-CHEK GUIDE) test strip Use to check blood sugars twice a day  E11.65  . hydrochlorothiazide (HYDRODIURIL) 25 MG tablet Take 1 tablet (25 mg total) by mouth daily.  Marland Kitchen ibuprofen (ADVIL,MOTRIN) 600 MG tablet Take 1 tablet (600 mg total) by mouth every 8 (eight) hours as needed.  . lidocaine (XYLOCAINE) 2 % solution Use as directed 5 mLs in the mouth or throat every 6 (six) hours as needed for mouth pain.  . meclizine (ANTIVERT) 25 MG tablet Take 1 tablet (25 mg total) by mouth 2 (two) times daily as needed for dizziness.  . metoprolol succinate (TOPROL-XL) 50 MG 24 hr tablet Take 50 mg by mouth daily. Take with or immediately following a meal.  . naproxen sodium (ANAPROX) 220 MG tablet Take 220 mg by mouth daily as needed (pain).  . traZODone (DESYREL) 50 MG tablet Take 0.5-1 tablets (25-50 mg total) by mouth at bedtime as needed for sleep.  . [DISCONTINUED] escitalopram (LEXAPRO) 20 MG tablet Take 1 tablet (20 mg total) by mouth daily.   No facility-administered encounter medications on file as of 01/07/2021.    Surgical History: Past Surgical History:  Procedure Laterality Date  . ABDOMINAL HYSTERECTOMY    . TUMOR EXCISION N/A  Medical History: Past Medical History:  Diagnosis Date  . Bronchitis   . Depression   . Diabetes mellitus without complication (HCC)   . Hypertension   . Migraine     Family History: Family History  Problem Relation Age of Onset  . Diabetes Other   . Diabetes Mother   . Kidney failure Mother   . Pancreatitis Mother   . Hyperlipidemia Father     Social History   Socioeconomic History  . Marital status: Single    Spouse name: Not on file  . Number of children: Not on file  . Years  of education: Not on file  . Highest education level: Not on file  Occupational History  . Not on file  Tobacco Use  . Smoking status: Former Smoker    Years: 3.00    Types: Cigarettes  . Smokeless tobacco: Never Used  Substance and Sexual Activity  . Alcohol use: Yes    Comment: socailly  . Drug use: No  . Sexual activity: Not on file  Other Topics Concern  . Not on file  Social History Narrative  . Not on file   Social Determinants of Health   Financial Resource Strain: Not on file  Food Insecurity: Not on file  Transportation Needs: Not on file  Physical Activity: Not on file  Stress: Not on file  Social Connections: Not on file  Intimate Partner Violence: Not on file      Review of Systems  Constitutional: Negative for chills, diaphoresis and fatigue.  HENT: Negative for ear pain, postnasal drip and sinus pressure.   Eyes: Negative for photophobia, discharge, redness, itching and visual disturbance.  Respiratory: Negative for cough, shortness of breath and wheezing.   Cardiovascular: Negative for chest pain, palpitations and leg swelling.  Gastrointestinal: Negative for abdominal pain, constipation, diarrhea, nausea and vomiting.  Genitourinary: Negative for dysuria and flank pain.  Musculoskeletal: Positive for neck pain and neck stiffness. Negative for arthralgias, back pain and gait problem.  Skin: Negative for color change.  Allergic/Immunologic: Negative for environmental allergies and food allergies.  Neurological: Positive for headaches. Negative for dizziness.  Hematological: Does not bruise/bleed easily.  Psychiatric/Behavioral: Positive for behavioral problems (depression). Negative for agitation and hallucinations.    Vital Signs: BP 106/87   Pulse 70   Temp (!) 97.5 F (36.4 C)   Resp 16   Ht 5\' 6"  (1.676 m)   Wt 191 lb 6.4 oz (86.8 kg)   SpO2 98%   BMI 30.89 kg/m    Physical Exam Vitals reviewed.  Constitutional:      Appearance: Normal  appearance. She is normal weight.  Cardiovascular:     Rate and Rhythm: Normal rate and regular rhythm.     Pulses: Normal pulses.     Heart sounds: Normal heart sounds.  Pulmonary:     Effort: Pulmonary effort is normal.     Breath sounds: Normal breath sounds.  Musculoskeletal:        General: Normal range of motion.     Cervical back: Normal range of motion.  Skin:    General: Skin is warm.  Neurological:     General: No focal deficit present.     Mental Status: She is alert and oriented to person, place, and time. Mental status is at baseline.  Psychiatric:        Attention and Perception: Attention normal.        Mood and Affect: Mood is depressed.  Speech: Speech normal.        Behavior: Behavior normal.        Cognition and Memory: Cognition normal.        Judgment: Judgment normal.    Assessment/Plan: 1. Chronic neck pain Being followed and managed by neurology, currently undergoing physical therapy  2. Moderate episode of recurrent major depressive disorder (HCC) Has been unable to tolerate Lexapro as well as Effexor in the past Awaiting appointment with psychiatry Encouraged to continue with looking into counseling or a therapist  3. Type 2 diabetes mellitus without complication, without long-term current use of insulin (HCC) Continue with Farxiga at this time, will be due for A1C check at next visit  4. Chronic nonintractable headache, unspecified headache type Being followed and managed by neurology, continue to monitor  General Counseling: Lili verbalizes understanding of the findings of todays visit and agrees with plan of treatment. I have discussed any further diagnostic evaluation that may be needed or ordered today. We also reviewed her medications today. she has been encouraged to call the office with any questions or concerns that should arise related to todays visit.   Time spent:30 Minutes Time spent includes review of chart, medications,  test results and follow-up plan with the patient.  This patient was seen by Leeanne Deed AGNP-C in Collaboration with Dr Lyndon Code as a part of collaborative care agreement     Lubertha Basque. Tarah Buboltz AGNP-C Internal medicine

## 2021-01-08 NOTE — Therapy (Signed)
[place] [facility] [address] [place], [address] Phone: [phone]   Fax:  [phone]  Physical Therapy Treatment  Patient Details  Name: Melanie Briggs MRN: [id number] Date of Birth: April 29, 1971 Referring Provider (PT): Nilda Calamity PA-C   Encounter Date: 01/07/2021   PT End of Session - 01/07/21 1806    Visit Number 5    Number of Visits 17    Date for PT Re-Evaluation 02/19/21    Authorization Type BCBS COMM PRO    Authorization Time Period 12/25/20-03/19/21    PT Start Time 1602    PT Stop Time 1645    PT Time Calculation (min) 43 min    Activity Tolerance Patient tolerated treatment well    Behavior During Therapy [facility] for tasks assessed/performed           Past Medical History:  Diagnosis Date  . Bronchitis   . Depression   . Diabetes mellitus without complication (HCC)   . Hypertension   . Migraine     Past Surgical History:  Procedure Laterality Date  . ABDOMINAL HYSTERECTOMY    . TUMOR EXCISION N/A     There were no vitals filed for this visit.   Subjective Assessment - 01/07/21 1804    Subjective Pt reports new onset of post forearm pain at work with clicking mouse on RUE. Localized pain that starts at wrist extensors and aches/throbs down to middle finger. Reports no radicualr pain in RUE but R sided headache still at 9/10 NPS. Pt requestion printed copies of PT notes for work due to shortened duty from her neck and head pain limiting her hours.    Pertinent History Melanie Briggs is a 49yoF who comes to [facility] for help with chronic persistent Rt headache associated with Rt neck pain in the suboccipital/upper trap distribution with more recent development of referral to the right dorsal forearm. Pt reports head/neck symptoms since successful resolution with combined PT and injections in 2018 at [facility], but returned as a daily phenomenon in January of 2021 around the time of the  passing of her mother. Pt reports the involvement of her dorsal forearm pain is new to this episode. Pt has head/neck/arm symptoms daily, but has 1-2x weekly vertiginous dizziness episodes with associated spinning sensation and visual phenomenon that would often last for entire day, now managed to ~45 minutes with use of meclezine therapy. Pt reports head/neck/arm symptoms are worse in morning, then improved hours into the day and thereafter. Pt is essentailly guarded and slow with ADL actiivty due to symptoms, but has clear worsening of pain with Rt cervical rotation, cervical extension, and Right cervical sidebending. Recent MRI unremarkable, MRI in 2018 for same problem also unremarkable.    Limitations Sitting;Reading    How long can you sit comfortably? ~4 hours for work    How long can you stand comfortably? WNL    How long can you walk comfortably? WNL    Diagnostic tests MRI    Patient Stated Goals return to full time at work    Currently in Pain? Yes    Pain Score 9     Pain Location Neck    Pain Orientation Right    Pain Descriptors / Indicators Aching    Pain Onset More than a month ago          Manual Therapy:   Pt in L side lying: Scapulothoracic mobs for pain modulation in R cervical spine/shoulder  musculature. X10/direction of Elevation/depression/retraction/upward rotation/downward rotation. Pt displaying difficulty in relaxing to allow passive motion from PT today and reports of N/T in median nerve distribution on R hand/fingers so discontinued today. Elimination of symptoms once stopped manual technique.  Pt in supine: STM to R sided cervical paraspinals and suboccipitals to reduce R cervical muscle spasms and pain modulation. 5 min.   Pt reports R sided head ache and neck pain from 9/10 NPS to 7/10 NPS.    There.ex:   Performed FOTO: 18/43  Assessment of new onset of R dorsum forearm pain. Localized tenderness to wrist extensor muscle origin on lateral epicondyle that is  concordant with palpation and refers to dorsum of hand, specifically the middle finger. Worsens concordant, dull ache pain with passive elbow extension, forearm pronation, and wrist flexion and with resisted wrist extension. Per pt reports, her dorsal forearm pain doesn't travel from her neck to the elbow to hand like her  radicular pain (reports it feels different) she has described and worsens with clicking her mouse at work. Pt educated it appears her pain is muscular and not the same radicular pain due to type of pain, reproducible with palpation and the fact her pain is referred with palpation from wrist extensor origin to her hand and not occurring from her neck down to her hand. Educated on use of ice, wrist extensor stretch, and wrist AROM to relieve symptoms.   Supine deep neck flexor to strengthen neck flexors. 2x12 reps. VC's and PT demo for form/technique. VC's to perform in pain free range.   Pec stretch in corner: 2x30 sec for posture. VC's and initial PT demo for improved form. Educated pt to stand closer to corner due to RUE weakness and visible shakiness from R radicular symptoms limiting her strength. Improved carryover after cuing with reduced shakiness and improved stability.   Wall push ups: x8. Difficulty to performed due to RUE weakness from radicular symptoms. Increased weight shift onto LUE. VC's in attempts to distribute weight equally onto L and R UE's. Required PT assist to return to starting point with equal weight shift due to the weakness. Discontinued for now for safety and too difficult of an exercise at this time.  Seated, resisted scap retractions with RTB: 2x12 reps. Good form/technique. Pt educated how to safely anchor TB at home and on sets/reps to perform along with frequency to begin strengthening back/shoulder/RUE.     PT Education - 01/07/21 1805    Education Details form/technique with exercises. Educated on R wrist extensor exercises and use of ice to improve  symptoms.    Person(s) Educated Patient    Methods Explanation;Demonstration;Tactile cues;Verbal cues    Comprehension Verbalized understanding;Returned demonstration            PT Short Term Goals - 12/25/20 1825      PT SHORT TERM GOAL #1   Title After 4 weeks pt to report 2-3 HEP actiivties to help with symptoms management with flareups.    Time 4    Period Weeks    Status New    Target Date 01/22/21      PT SHORT TERM GOAL #2   Title After 4 weeks pt to report successful plan for safe walking program performance.    Baseline fearful of vertigo episode whilst walking, has been avoiding extensive walking    Time 4    Period Weeks    Status New    Target Date 01/22/21  PT Long Term Goals - 01/07/21 1812      PT LONG TERM GOAL #1   Title After 8 weeks pt to show 8 point improvement on FOTO survery to reflect improved functional activity tolerance.    Baseline 4/27: 18/43    Time 8    Period Weeks    Status New    Target Date 02/19/21      PT LONG TERM GOAL #2   Title After 8 weeks demonstrate improved cervical ROM in rotation >65 degrees bilat without pain provocation.    Baseline ~45 degrees bilat with pain during Rt rotation    Time 8    Period Weeks    Status New      PT LONG TERM GOAL #3   Title After 12 weeks pt to report ability to perform 8 hour workday without symptoms exacerbation pain>2 numbers on NPRS.    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    Baseline Unable to use RUE for hair management, pain and guarded with dressing self    Time 12    Period Weeks    Status New      PT LONG TERM GOAL #5   Title Pt will display R grip strength equal to LUE to demonstrate clinically significant improvment from cervical  radicular symptoms to perform basic ADL's such as opening doors, cans, turning steering wheel.    Baseline L: 79-80 Kg, R: 10 Kg    Time 12    Period Weeks    Status New                 Plan - 01/07/21 1807    Clinical Impression Statement Pt completed FOTO today scoring 18 with a target goal of 43. PT assessed new onset of R forearm pain that pt reported. PT educated pt it does not seem to be related to cervical radicular pain due to localized pain at wrist extensor origin that is concordant with palpation and passive elbow extension, wrist flexion and forearm pronation. Also pain refers from forearm to middle finger, and does not travel from cervical spine down to hand and is a dull ache consistent with muscular pain and not nerve pain. Educated on use of ice and wrist extensor stretches/mobility to address pain. 9/10 cervical/R sided headache reduced to 7/10 NPS after session. Pt updated HEP with resisted scap retractions and pec stretch in corner to address postural deficits. Pt appears to have centralized symptoms in RUE and now only has c/o of R sided neck/head pain and L sided neck/head pain continues to be absent. However, pt still displays significant weakness on RUE from radicular symptoms although radicular pain is improving. Intermittent N/T with certain exercises do occur today in median nerve distribution in particular with scapulothoracic mobs. Pt will benefit from skilled PT services to continue to improve pain levels and strength so pt can return to PLOF and work full time.    Personal Factors and Comorbidities Age;Education;Time since onset of injury/illness/exacerbation;Past/Current Experience    Examination-Activity Limitations Bathing;Bed Mobility;Carry;Dressing;Hygiene/Grooming;Lift;Reach Overhead;Self Feeding    Examination-Participation Restrictions Meal Prep;Cleaning;Occupation;Driving;Community Activity;Interpersonal Relationship;Laundry;Yard Work;Shop     Stability/Clinical Decision Making Unstable/Unpredictable    Rehab Potential Good  PT Frequency 2x / week    PT Duration 12 weeks    PT Treatment/Interventions ADLs/Self Care Home Management;Cryotherapy;Electrical Stimulation;Moist Heat;DME Instruction;Therapeutic exercise;Balance training;Neuromuscular re-education;Therapeutic activities;Functional mobility training;Patient/family education;Manual techniques;Passive range of motion;Dry needling;Vestibular    PT Next Visit Plan Progress postural muscle strength and RUE strength    PT Home Exercise Plan R UT stretch, supine cervical retractions, STM with tennis ball on wall on rhomboids and paraspinals. Levator scap stretch, scap retractions.    Consulted and Agree with Plan of Care Patient           Patient will benefit from skilled therapeutic intervention in order to improve the following deficits and impairments:  Abnormal gait,Decreased activity tolerance,Decreased balance,Decreased range of motion,Decreased strength,Hypomobility,Dizziness,Increased muscle spasms,Impaired flexibility,Postural dysfunction  Visit Diagnosis: Cervicalgia  Cervicogenic headache     Problem List There are no problems to display for this patient.   Delphia GratesMilton M. Fairly IV, PT, DPT Physical Therapist- [place]  [facility]  01/08/2021, 7:55 AM  [place] [facility] [address] [place], [place], [id number] Phone: [phone]   Fax:  [phone]  Name: Melanie Briggs MRN: [id number] Date of Birth: 10-29-1970

## 2021-01-09 ENCOUNTER — Telehealth: Payer: Self-pay

## 2021-01-09 ENCOUNTER — Other Ambulatory Visit: Payer: Self-pay | Admitting: Hospice and Palliative Medicine

## 2021-01-09 DIAGNOSIS — F331 Major depressive disorder, recurrent, moderate: Secondary | ICD-10-CM

## 2021-01-09 NOTE — Telephone Encounter (Signed)
Revisesd disability paperwork filled out by Melanie Briggs and picked up by patient on 01-09-21. Copy  Placed in scan. [name]

## 2021-01-12 ENCOUNTER — Other Ambulatory Visit: Payer: Self-pay

## 2021-01-12 ENCOUNTER — Ambulatory Visit: Payer: BC Managed Care – PPO | Attending: Physician Assistant

## 2021-01-12 DIAGNOSIS — R42 Dizziness and giddiness: Secondary | ICD-10-CM | POA: Diagnosis not present

## 2021-01-12 DIAGNOSIS — M542 Cervicalgia: Secondary | ICD-10-CM | POA: Diagnosis not present

## 2021-01-12 DIAGNOSIS — R519 Headache, unspecified: Secondary | ICD-10-CM | POA: Diagnosis not present

## 2021-01-12 DIAGNOSIS — R29898 Other symptoms and signs involving the musculoskeletal system: Secondary | ICD-10-CM | POA: Diagnosis not present

## 2021-01-12 DIAGNOSIS — G4486 Cervicogenic headache: Secondary | ICD-10-CM | POA: Insufficient documentation

## 2021-01-12 NOTE — Therapy (Signed)
[facility] [address] [place], [address] Phone: [phone]   Fax:  [phone]  Physical Therapy Treatment  Patient Details  Name: Melanie Briggs MRN: [id number] Date of Birth: 01/13/1971 Referring Provider (PT): Nilda Calamity PA-C   Encounter Date: 01/12/2021   PT End of Session - 01/12/21 1654    Visit Number 6    Number of Visits 17    Date for PT Re-Evaluation 02/19/21    Authorization Type BCBS COMM PRO    Authorization Time Period 12/25/20-03/19/21    PT Start Time 1604    PT Stop Time 1645    PT Time Calculation (min) 41 min    Activity Tolerance Patient tolerated treatment well    Behavior During Therapy [facility] for tasks assessed/performed           Past Medical History:  Diagnosis Date  . Bronchitis   . Depression   . Diabetes mellitus without complication (HCC)   . Hypertension   . Migraine     Past Surgical History:  Procedure Laterality Date  . ABDOMINAL HYSTERECTOMY    . TUMOR EXCISION N/A     There were no vitals filed for this visit.   Subjective Assessment - 01/12/21 1650    Subjective Pt reports seeing Nilda Calamity, PA today prior to PT. Pt updated PT that she is taking a short term leave of absence from work due to hear headaches, neck pain, and RUE pain. Update on depression medication to Zoloft. MRI to be scheduled in near future for further investigation of cervical nerve compression involvement. Pt reports 10/10 R sided head ache and neck pain.    Pertinent History Melanie Briggs is a 49yoF who comes to [facility] for help with chronic persistent Rt headache associated with Rt neck pain in the suboccipital/upper trap distribution with more recent development of referral to the right dorsal forearm. Pt reports head/neck symptoms since successful resolution with combined PT and injections in 2018 at [facility], but returned as a daily phenomenon in January of 2021 around the  time of the passing of her mother. Pt reports the involvement of her dorsal forearm pain is new to this episode. Pt has head/neck/arm symptoms daily, but has 1-2x weekly vertiginous dizziness episodes with associated spinning sensation and visual phenomenon that would often last for entire day, now managed to ~45 minutes with use of meclezine therapy. Pt reports head/neck/arm symptoms are worse in morning, then improved hours into the day and thereafter. Pt is essentailly guarded and slow with ADL actiivty due to symptoms, but has clear worsening of pain with Rt cervical rotation, cervical extension, and Right cervical sidebending. Recent MRI unremarkable, MRI in 2018 for same problem also unremarkable.    Limitations Sitting;Reading    How long can you sit comfortably? ~4 hours for work    How long can you stand comfortably? WNL    How long can you walk comfortably? WNL    Diagnostic tests MRI    Patient Stated Goals return to full time at work    Currently in Pain? Yes    Pain Score 10-Worst pain ever    Pain Location Neck    Pain Orientation Right    Pain Descriptors / Indicators Aching    Pain Type Chronic pain    Pain Onset More than a month ago          Manual Therapy:   Pt in supine  STM to R sided scalenes to reduce R sided neck pain and improve ability to complete deep neck flexor activation with less pain. 5 min.    Cervical manual traction: 4x30 sec to reduce pain in RUE and headaches.      There.ex:   Seated exercises:    Chin tucks: 2x12 reps. VC's for performing to tolerance in pain free range    Resisted scap retractions with RTB: 2x12. Tied loop for R hand to go around wrist due to poor grip strength from radicular symptoms.   Standing exercises:    R sided radial and median nerve glides. 2x12 reps/nerve mobilization technique. Required PT demo and mod TC's/VC's for form/technique at head and wrist. Initial increase in symptoms but eased as pt progressed through  exercise. 10/10 pain NPS to 8/10 NPS  Supine exercises:    Deep neck flexor cervical flexion: 2x12 reps. First set painful. Performed manual therapy to R sided scalenes with improvement in pain symptoms. Then performed second set. Pt reports easier to perform due to less pain.    Cervical retraction chin tucks: 2x12, with improvement in symptoms after scalene soft tissue release compared to seated chin tucks.       Pt reported 10/10 pain NPS in R head ache, neck and UE. Post session, pt reports 5/10 NPS in same regions. Given handout for nerve glides to perform at home. Frequency, reps, sets were given. Pt began paperwork in order to obtain copy of PT notes for short term leave of absence for work.    PT Education - 01/12/21 1653    Education Details Pt given handout of radial and median nerve glides. Form/technique with exercises.    Person(s) Educated Patient    Methods Explanation;Demonstration;Tactile cues;Verbal cues    Comprehension Verbalized understanding;Returned demonstration            PT Short Term Goals - 12/25/20 1825      PT SHORT TERM GOAL #1   Title After 4 weeks pt to report 2-3 HEP actiivties to help with symptoms management with flareups.    Time 4    Period Weeks    Status New    Target Date 01/22/21      PT SHORT TERM GOAL #2   Title After 4 weeks pt to report successful plan for safe walking program performance.    Baseline fearful of vertigo episode whilst walking, has been avoiding extensive walking    Time 4    Period Weeks    Status New    Target Date 01/22/21             PT Long Term Goals - 01/07/21 1812      PT LONG TERM GOAL #1   Title After 8 weeks pt to show 8 point improvement on FOTO survery to reflect improved functional activity tolerance.    Baseline 4/27: 18/43    Time 8    Period Weeks    Status New    Target Date 02/19/21      PT LONG TERM GOAL #2   Title After 8 weeks demonstrate improved cervical ROM in rotation >65  degrees bilat without pain provocation.    Baseline ~45 degrees bilat with pain during Rt rotation    Time 8    Period Weeks    Status New      PT LONG TERM GOAL #3   Title After 12 weeks pt to report ability to perform 8 hour workday without symptoms exacerbation pain>2  numbers on NPRS.    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    Baseline Unable to use RUE for hair management, pain and guarded with dressing self    Time 12    Period Weeks    Status New      PT LONG TERM GOAL #5   Title Pt will display R grip strength equal to LUE to demonstrate clinically significant improvment from cervical radicular symptoms to perform basic ADL's such as opening doors, cans, turning steering wheel.    Baseline L: 79-80 Kg, R: 10 Kg    Time 12    Period Weeks    Status New                 Plan - 01/12/21 1656    Clinical Impression Statement Pt re-edcuated on radial nerve glides and the addition of median nerve glides and given handout to perform as part of her HEP. Able to decrease nerve pain related symptoms in RUE from 10/10 NPS to 8/10 NPS. Pt required a tied loop in RTB around wrist in order to adequately perform resisted scap retractions due to significant weakness in her R hand from radicular symptoms. PT educated on how to perform at home so she does not have to rely on poor grip strength to hold TB. With use of manual techniques and therapeutic exercise, able to overall decrease R sided head ache, neck pain, and RUE pain to a 5/10 NPS on the whole R side post treatment. Pt can continue to benefit from further skilled PT services to progress exercise and decrease pain so pt can return to work and improved ability to complete functional activities with less pain.     Personal Factors and Comorbidities Age;Education;Time since onset of injury/illness/exacerbation;Past/Current Experience    Examination-Activity Limitations Bathing;Bed Mobility;Carry;Dressing;Hygiene/Grooming;Lift;Reach Overhead;Self Feeding    Examination-Participation Restrictions Meal Prep;Cleaning;Occupation;Driving;Community Activity;Interpersonal Relationship;Laundry;Yard Work;Shop    Stability/Clinical Decision Making Unstable/Unpredictable    Rehab Potential Good    PT Frequency 2x / week    PT Duration 12 weeks    PT Treatment/Interventions ADLs/Self Care Home Management;Cryotherapy;Electrical Stimulation;Moist Heat;DME Instruction;Therapeutic exercise;Balance training;Neuromuscular re-education;Therapeutic activities;Functional mobility training;Patient/family education;Manual techniques;Passive range of motion;Dry needling;Vestibular    PT Next Visit Plan Progress postural muscle strength and RUE strength    PT Home Exercise Plan R UT stretch, supine cervical retractions, STM with tennis ball on wall on rhomboids and paraspinals. Levator scap stretch, scap retractions. Radial/median nerve glides    Consulted and Agree with Plan of Care Patient           Patient will benefit from skilled therapeutic intervention in order to improve the following deficits and impairments:  Abnormal gait,Decreased activity tolerance,Decreased balance,Decreased range of motion,Decreased strength,Hypomobility,Dizziness,Increased muscle spasms,Impaired flexibility,Postural dysfunction  Visit Diagnosis: Cervicalgia     Problem List There are no problems to display for this patient.   Delphia Grates. Fairly IV, PT, DPT Physical Therapist- [place]  [facility]  01/12/2021, 5:11 PM  [place] [facility]TS [facility]IC[facility] [address], [place], [address] Phone: [phone]   Fax:  [phone]  Name: Melanie Briggs MRN:  [id number] Date of Birth: 21-Jun-1971

## 2021-01-14 ENCOUNTER — Encounter: Payer: Self-pay | Admitting: Hospice and Palliative Medicine

## 2021-01-14 ENCOUNTER — Ambulatory Visit: Payer: BC Managed Care – PPO

## 2021-01-14 ENCOUNTER — Other Ambulatory Visit: Payer: Self-pay

## 2021-01-14 DIAGNOSIS — G4486 Cervicogenic headache: Secondary | ICD-10-CM

## 2021-01-14 DIAGNOSIS — M542 Cervicalgia: Secondary | ICD-10-CM

## 2021-01-14 DIAGNOSIS — R29898 Other symptoms and signs involving the musculoskeletal system: Secondary | ICD-10-CM | POA: Diagnosis not present

## 2021-01-14 NOTE — Therapy (Signed)
[place] [facility] PHYSICAL AND SPORTS MEDICINE [address] [place], [address] Phone: [phone]   Fax:  [phone]  Physical Therapy Treatment  Patient Details  Name: Peniel Biel MRN: [id number] Date of Birth: [birth year] Referring Provider (PT): Nilda Calamity PA-C   Encounter Date: 01/14/2021   PT End of Session - 01/14/21 1622    Visit Number 7    Number of Visits 17    Date for PT Re-Evaluation 02/19/21    Authorization Type BCBS COMM PRO    Authorization Time Period 12/25/20-03/19/21    PT Start Time 1615    PT Stop Time 1645    PT Time Calculation (min) 30 min    Activity Tolerance Patient tolerated treatment well    Behavior During Therapy [facility] for tasks assessed/performed           Past Medical History:  Diagnosis Date  . Bronchitis   . Depression   . Diabetes mellitus without complication (HCC)   . Hypertension   . Migraine     Past Surgical History:  Procedure Laterality Date  . ABDOMINAL HYSTERECTOMY    . TUMOR EXCISION N/A     There were no vitals filed for this visit.   Subjective Assessment - 01/14/21 1616    Subjective Patient reports that Erven Colla suggested that needs a functional capacity eval. Patient reports 11/10 R sided head/neck pain, increased with moving too quickly while transferring from the bed. Patient states that she was able to tolerate HEP yesterday and had symptom relief for ~4 hours after therapy session on Monday.    Pertinent History Adama Ivins is a 49yoF who comes to [facility] OPPT for help with chronic persistent Rt headache associated with Rt neck pain in the suboccipital/upper trap distribution with more recent development of referral to the right dorsal forearm. Pt reports head/neck symptoms since successful resolution with combined PT and injections in 2018 at [facility], but returned as a daily phenomenon in January of 2021 around the time of the passing of her mother. Pt reports  the involvement of her dorsal forearm pain is new to this episode. Pt has head/neck/arm symptoms daily, but has 1-2x weekly vertiginous dizziness episodes with associated spinning sensation and visual phenomenon that would often last for entire day, now managed to ~45 minutes with use of meclezine therapy. Pt reports head/neck/arm symptoms are worse in morning, then improved hours into the day and thereafter. Pt is essentailly guarded and slow with ADL actiivty due to symptoms, but has clear worsening of pain with Rt cervical rotation, cervical extension, and Right cervical sidebending. Recent MRI unremarkable, MRI in 2018 for same problem also unremarkable.    Limitations Sitting;Reading    How long can you sit comfortably? ~4 hours for work    How long can you stand comfortably? WNL    How long can you walk comfortably? WNL    Diagnostic tests MRI    Patient Stated Goals return to full time at work    Currently in Pain? Yes    Pain Score 10-Worst pain ever    Pain Location Neck    Pain Onset More than a month ago           PT limited in session today due to being 15 min late to appointment.   Manual Therapy:   5 min spent to modulate pain further. Not billed. STM to R scalenes and suboccipital release to ease R sided head aches and neck  pain.     There.ex:              Seated exercises:                          Chin tucks: 2x12 reps. VC's for performing to tolerance in pain free range                          Resisted scap retractions with RTB: 2x12. Tied loop for R hand to go around wrist due to poor grip strength from radicular symptoms.              Standing exercises:                          R sided radial and median nerve glides. 2x12 reps/nerve mobilization technique. Required PT demo and mod TC's/VC's for form/technique at head and wrist. Initial increase in symptoms but eased as pt progressed through exercise. Modified median nerve glide to improve patient tolerance to exercise  in seated. Pt educated how to perform modified median nerve glide in seated.              Supine exercises:                         Cervical retractions : 2x12 reps. First set painful. Performed manual therapy to R sided scalenes with improvement in pain symptoms while performing through cervical retractions. Then performed second set. Pt reports easier to perform due to less pain.        Pt reports pain from 11/10 NPS to 7/10 NPS in R sided neck pain and headache    PT Education - 01/14/21 1621    Education Details Form/techique with exercises    Person(s) Educated Patient    Methods Explanation;Demonstration;Verbal cues;Tactile cues    Comprehension Verbalized understanding;Returned demonstration            PT Short Term Goals - 12/25/20 1825      PT SHORT TERM GOAL #1   Title After 4 weeks pt to report 2-3 HEP actiivties to help with symptoms management with flareups.    Time 4    Period Weeks    Status New    Target Date 01/22/21      PT SHORT TERM GOAL #2   Title After 4 weeks pt to report successful plan for safe walking program performance.    Baseline fearful of vertigo episode whilst walking, has been avoiding extensive walking    Time 4    Period Weeks    Status New    Target Date 01/22/21             PT Long Term Goals - 01/07/21 1812      PT LONG TERM GOAL #1   Title After 8 weeks pt to show 8 point improvement on FOTO survery to reflect improved functional activity tolerance.    Baseline 4/27: 18/43    Time 8    Period Weeks    Status New    Target Date 02/19/21      PT LONG TERM GOAL #2   Title After 8 weeks demonstrate improved cervical ROM in rotation >65 degrees bilat without pain provocation.    Baseline ~45 degrees bilat with pain during Rt rotation    Time 8    Period Weeks  Status New      PT LONG TERM GOAL #3   Title After 12 weeks pt to report ability to perform 8 hour workday without symptoms exacerbation pain>2 numbers on NPRS.     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    Baseline Unable to use RUE for hair management, pain and guarded with dressing self    Time 12    Period Weeks    Status New      PT LONG TERM GOAL #5   Title Pt will display R grip strength equal to LUE to demonstrate clinically significant improvment from cervical radicular symptoms to perform basic ADL's such as opening doors, cans, turning steering wheel.    Baseline L: 79-80 Kg, R: 10 Kg    Time 12    Period Weeks    Status New                 Plan - 01/14/21 1623    Clinical Impression Statement Patient demonstrates some increased diffculty and muscle weakness while performing scapular retractions and nerve glide exercises reporting increased. burning and soreness. Patient continues to show R poor grip stength while holding theraband during exercise. Modified median nerve glide with therapist assistance was performed to improve patient's tolerance. Post treatment pain reported to be form 11/10 NPS to 7/10 NPS. Pt educated on close by PT that is certified to complete FCE. Patient will further benefit from skilled PT services to decrease pain, improve muscle strength to return to work and improve functional ability.    Personal Factors and Comorbidities Age;Education;Time since onset of injury/illness/exacerbation;Past/Current Experience    Examination-Activity Limitations Bathing;Bed Mobility;Carry;Dressing;Hygiene/Grooming;Lift;Reach Overhead;Self Feeding    Examination-Participation Restrictions Meal Prep;Cleaning;Occupation;Driving;Community Activity;Interpersonal Relationship;Laundry;Yard Work;Shop    Stability/Clinical Decision Making Unstable/Unpredictable    Rehab Potential Good    PT Frequency 2x / week    PT  Duration 12 weeks    PT Treatment/Interventions ADLs/Self Care Home Management;Cryotherapy;Electrical Stimulation;Moist Heat;DME Instruction;Therapeutic exercise;Balance training;Neuromuscular re-education;Therapeutic activities;Functional mobility training;Patient/family education;Manual techniques;Passive range of motion;Dry needling;Vestibular    PT Next Visit Plan Progress postural muscle strength and RUE strength    PT Home Exercise Plan R UT stretch, supine cervical retractions, STM with tennis ball on wall on rhomboids and paraspinals. Levator scap stretch, scap retractions. Radial/median nerve glides    Consulted and Agree with Plan of Care Patient           Patient will benefit from skilled therapeutic intervention in order to improve the following deficits and impairments:  Abnormal gait,Decreased activity tolerance,Decreased balance,Decreased range of motion,Decreased strength,Hypomobility,Dizziness,Increased muscle spasms,Impaired flexibility,Postural dysfunction  Visit Diagnosis: Cervicalgia  Cervicogenic headache     Problem List There are no problems to display for this patient.   Delphia Grates. Fairly IV, PT, DPT Physical Therapist- [place]  [facility]  01/14/2021, 5:02 PM  [place] [facility] PHYSICAL AND SPORTS MEDICINE [address], [place], [address] Phone: [phone]   Fax:  [phone]  Name: Josy Peaden MRN: [id number] Date of Birth: 05-16-1971

## 2021-01-15 ENCOUNTER — Other Ambulatory Visit: Payer: Self-pay | Admitting: Physician Assistant

## 2021-01-15 DIAGNOSIS — R519 Headache, unspecified: Secondary | ICD-10-CM

## 2021-01-19 ENCOUNTER — Other Ambulatory Visit: Payer: Self-pay

## 2021-01-19 ENCOUNTER — Ambulatory Visit
Admission: RE | Admit: 2021-01-19 | Discharge: 2021-01-19 | Disposition: A | Discharge status: Home / Self Care | Payer: BC Managed Care – PPO | Source: Ambulatory Visit | Attending: Physician Assistant | Admitting: Physician Assistant

## 2021-01-19 ENCOUNTER — Ambulatory Visit: Payer: BC Managed Care – PPO

## 2021-01-19 DIAGNOSIS — R519 Headache, unspecified: Secondary | ICD-10-CM | POA: Diagnosis not present

## 2021-01-19 DIAGNOSIS — M542 Cervicalgia: Secondary | ICD-10-CM | POA: Diagnosis not present

## 2021-01-19 DIAGNOSIS — G4486 Cervicogenic headache: Secondary | ICD-10-CM | POA: Diagnosis not present

## 2021-01-19 NOTE — Therapy (Signed)
[place] [facility] PH[facility]ORTS MEDICINE [address] [place], [address] Phone: [phone]   Fax:  [phone]  Physical Therapy Treatment  Patient Details  Name: Melanie Briggs MRN: [id number] Date of Birth: 1971-08-27 Referring Provider (PT): Melanie Calamity PA-C   Encounter Date: 01/19/2021   PT End of Session - 01/19/21 1623    Visit Number 8    Number of Visits 17    Date for PT Re-Evaluation 02/19/21    Authorization Type BCBS COMM PRO    Authorization Time Period 12/25/20-03/19/21    PT Start Time 1600    PT Stop Time 1645    PT Time Calculation (min) 45 min    Activity Tolerance Patient tolerated treatment well    Behavior During Therapy [facility] for tasks assessed/performed           Past Medical History:  Diagnosis Date  . Bronchitis   . Depression   . Diabetes mellitus without complication (HCC)   . Hypertension   . Migraine     Past Surgical History:  Procedure Laterality Date  . ABDOMINAL HYSTERECTOMY    . TUMOR EXCISION N/A     There were no vitals filed for this visit.   Subjective Assessment - 01/19/21 1608    Subjective Pt reports 13/10 pain on R sided of head. Believes it is due to stress of Mother's Day weekend and recent passing of her mother. MRI scheduled this evening.    Pertinent History Melanie Briggs is a 49yoF who comes to [facility] for help with chronic persistent Rt headache associated with Rt neck pain in the suboccipital/upper trap distribution with more recent development of referral to the right dorsal forearm. Pt reports head/neck symptoms since successful resolution with combined PT and injections in 2018 at [facility], but returned as a daily phenomenon in January of 2021 around the time of the passing of her mother. Pt reports the involvement of her dorsal forearm pain is new to this episode. Pt has head/neck/arm symptoms daily, but has 1-2x weekly vertiginous dizziness episodes with  associated spinning sensation and visual phenomenon that would often last for entire day, now managed to ~45 minutes with use of meclezine therapy. Pt reports head/neck/arm symptoms are worse in morning, then improved hours into the day and thereafter. Pt is essentailly guarded and slow with ADL actiivty due to symptoms, but has clear worsening of pain with Rt cervical rotation, cervical extension, and Right cervical sidebending. Recent MRI unremarkable, MRI in 2018 for same problem also unremarkable.    Limitations Sitting;Reading    How long can you sit comfortably? ~4 hours for work    How long can you stand comfortably? WNL    How long can you walk comfortably? WNL    Diagnostic tests MRI    Patient Stated Goals return to full time at work    Currently in Pain? Yes    Pain Score --   13/10   Pain Orientation Right    Pain Descriptors / Indicators Aching    Pain Type Chronic pain    Pain Onset More than a month ago          Manual Therapy:  30 min total for improving headaches and cervical pain with pt in supine. STM to R SCM with pt activating R SCM (L rotation and cervical flexion) MWM for 10 min. 10 min suboccipital release, 5 min STM to R scalenes. 5 min STM to B cervical paraspinals.  Decrease in pain from 13/10 NPS to 8/10 NPS.      There.ex:   Seated B SCM activation (cervical rotation + flexion) to improve head aches and SCM mobility.   Resisted scap retractions with GTB: 2x20 reps. Good form/technique. Still requires tied loop to go around R wrist due to significant grip weakness.   Supine chin tucks: 2x15   Supine capital flexion for deep neck flexion strength: 2x15    Further decrease in pain to 7/10 NPS in R sided head aches, neck and RUE. Education provided on stress management strategies to help manage painful flare ups with life stressors.    PT Education - 01/19/21 1622    Education Details form/technique with exercises.    Person(s) Educated Patient    Methods  Explanation;Demonstration;Tactile cues;Verbal cues    Comprehension Verbalized understanding;Returned demonstration            PT Short Term Goals - 12/25/20 1825      PT SHORT TERM GOAL #1   Title After 4 weeks pt to report 2-3 HEP actiivties to help with symptoms management with flareups.    Time 4    Period Weeks    Status New    Target Date 01/22/21      PT SHORT TERM GOAL #2   Title After 4 weeks pt to report successful plan for safe walking program performance.    Baseline fearful of vertigo episode whilst walking, has been avoiding extensive walking    Time 4    Period Weeks    Status New    Target Date 01/22/21             PT Long Term Goals - 01/07/21 1812      PT LONG TERM GOAL #1   Title After 8 weeks pt to show 8 point improvement on FOTO survery to reflect improved functional activity tolerance.    Baseline 4/27: 18/43    Time 8    Period Weeks    Status New    Target Date 02/19/21      PT LONG TERM GOAL #2   Title After 8 weeks demonstrate improved cervical ROM in rotation >65 degrees bilat without pain provocation.    Baseline ~45 degrees bilat with pain during Rt rotation    Time 8    Period Weeks    Status New      PT LONG TERM GOAL #3   Title After 12 weeks pt to report ability to perform 8 hour workday without symptoms exacerbation pain>2 numbers on NPRS.    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    Baseline Unable to use RUE for hair management, pain and guarded with dressing self    Time 12    Period Weeks    Status New      PT LONG TERM GOAL #5   Title Pt will display R grip strength equal to LUE to demonstrate clinically significant improvment from cervical radicular symptoms to perform basic ADL's such as opening  doors, cans, turning steering wheel.    Baseline L: 79-80 Kg, R: 10 Kg    Time 12    Period Weeks    Status  New                 Plan - 01/19/21 1735    Clinical Impression Statement Pt educated throughout session on stress management strategies to reduce an increase in symptoms. Able to reduce R sided head ache, neck, and RUE pain from 13/10 NPS to 7/10 NPS with manual therapy and exercise. Able to reproduce R sided head ache with palpation to R sternocleidomastoid with L cervical rotation with cervical flexion and palpation along muscle bellly and musculotendinous unit as mastoid process. Pt to receive MRI this p.m. for further investigation of continued cervical radicular symptoms. Will benefit from further skilled PT services to decrease pain, improve mobility, and strength so pt can return to [employer] and work full time.    Personal Factors and Comorbidities Age;Education;Time since onset of injury/illness/exacerbation;Past/Current Experience    Examination-Activity Limitations Bathing;Bed Mobility;Carry;Dressing;Hygiene/Grooming;Lift;Reach Overhead;Self Feeding    Examination-Participation Restrictions Meal Prep;Cleaning;Occupation;Driving;Community Activity;Interpersonal Relationship;Laundry;Yard Work;Shop    Stability/Clinical Decision Making Unstable/Unpredictable    Rehab Potential Good    PT Frequency 2x / week    PT Duration 12 weeks    PT Treatment/Interventions ADLs/Self Care Home Management;Cryotherapy;Electrical Stimulation;Moist Heat;DME Instruction;Therapeutic exercise;Balance training;Neuromuscular re-education;Therapeutic activities;Functional mobility training;Patient/family education;Manual techniques;Passive range of motion;Dry needling;Vestibular    PT Next Visit Plan Progress postural muscle strength and RUE strength    PT Home Exercise Plan R UT stretch, supine cervical retractions, STM with tennis ball on wall on rhomboids and paraspinals. Levator scap stretch, scap  retractions. Radial/median nerve glides    Consulted and Agree with Plan of Care Patient           Patient will benefit from skilled therapeutic intervention in order to improve the following deficits and impairments:  Abnormal gait,Decreased activity tolerance,Decreased balance,Decreased range of motion,Decreased strength,Hypomobility,Dizziness,Increased muscle spasms,Impaired flexibility,Postural dysfunction  Visit Diagnosis: Cervicalgia  Cervicogenic headache     Problem List There are no problems to display for this patient.   Delphia Grates. Fairly IV, PT, DPT Physical Therapist- [place]  [facility]  01/19/2021, 5:45 PM  [place] [facility] PHYSICAL AND SPORTS MEDICINE [address], [place], [address] Phone: [phone]   Fax:  [phone]  Name: Melanie Briggs MRN: [id number] Date of Birth: 1970-11-20

## 2021-01-21 ENCOUNTER — Other Ambulatory Visit: Payer: Self-pay

## 2021-01-21 ENCOUNTER — Ambulatory Visit: Payer: BC Managed Care – PPO

## 2021-01-21 DIAGNOSIS — M542 Cervicalgia: Secondary | ICD-10-CM | POA: Diagnosis not present

## 2021-01-21 DIAGNOSIS — G4486 Cervicogenic headache: Secondary | ICD-10-CM

## 2021-01-21 NOTE — Therapy (Addendum)
[facility] PH[facility]ORTS MEDICINE [address], [place], [address] Phone: [phone]   Fax:  [phone]  Physical Therapy Treatment  Patient Details  Name: Melanie Briggs MRN: [id number] Date of Birth: 1970-09-17 Referring Provider (PT): Nilda Calamity PA-C   Encounter Date: 01/21/2021   PT End of Session - 01/21/21 1619    Visit Number 9    Number of Visits 17    Date for PT Re-Evaluation 02/19/21    Authorization Type BCBS COMM PRO    Authorization Time Period 12/25/20-03/19/21    PT Start Time 1601    PT Stop Time 1645    PT Time Calculation (min) 44 min    Activity Tolerance Patient tolerated treatment well    Behavior During Therapy [facility] for tasks assessed/performed           Past Medical History:  Diagnosis Date  . Bronchitis   . Depression   . Diabetes mellitus without complication (HCC)   . Hypertension   . Migraine     Past Surgical History:  Procedure Laterality Date  . ABDOMINAL HYSTERECTOMY    . TUMOR EXCISION N/A     There were no vitals filed for this visit.   Subjective Assessment - 01/21/21 1609    Subjective Pt comes to therapy with questions about MRI results. Pain today 10/10 on R sided headaches and neck. 6/10 on L side. Medication changes have not been helpful yet but pt reports MD states meds can take 2 weeks before tkaing effect.    Pertinent History Melanie Briggs is a 49yoF who comes to [facility] for help with chronic persistent Rt headache associated with Rt neck pain in the suboccipital/upper trap distribution with more recent development of referral to the right dorsal forearm. Pt reports head/neck symptoms since successful resolution with combined PT and injections in 2018 at [facility], but returned as a daily phenomenon in January of 2021 around the time of the passing of her mother. Pt reports the involvement of her dorsal forearm pain is new to this episode. Pt has head/neck/arm  symptoms daily, but has 1-2x weekly vertiginous dizziness episodes with associated spinning sensation and visual phenomenon that would often last for entire day, now managed to ~45 minutes with use of meclezine therapy. Pt reports head/neck/arm symptoms are worse in morning, then improved hours into the day and thereafter. Pt is essentailly guarded and slow with ADL actiivty due to symptoms, but has clear worsening of pain with Rt cervical rotation, cervical extension, and Right cervical sidebending. Recent MRI unremarkable, MRI in 2018 for same problem also unremarkable.    Limitations Sitting;Reading    How long can you sit comfortably? ~4 hours for work    How long can you stand comfortably? WNL    How long can you walk comfortably? WNL    Diagnostic tests MRI    Patient Stated Goals return to full time at work    Currently in Pain? Yes    Pain Score 10-Worst pain ever    Pain Location Neck    Pain Orientation Right    Pain Descriptors / Indicators Aching    Pain Type Chronic pain    Pain Onset More than a month ago           There.ex:  Supine deep neck flexion + chin tuck with occiput off mat table: 1x5. Increased pain along R sided SCM.   Supine B SCM activation (cervical rotation + flexion)  to improve head aches and SCM mobility. X10/direction. Pain with  L rotation so attempts in seated to L. 1x10 to L with improvement in symptoms with capital flexion then rotation.  Upper trap shrugs in seated with scap retraction: 2x10. VC's/TC's for form/technique.   Seated Isometric resisted cervical extension with YTB: 1x10 Seated exercise ball roll outs for thoracic mobility with hands clasped together (AAROM for shoulder): 1x12  L sided headache from 6/10 NPS to 3/10 NPS. R sided headache/neck pain from 10/10 to 9/10 NPS.   Manual Therapy: 9 min total  Pt in supine. STM to B cervical paraspinals for pain modulation and reducing muscle tension, cervicogenic head aches (4 min).   Suboccipital release for pain modulation and reducing muscle tension, cervicogenic head aches (4 min)  2x30 sec bouts of manual cervical traction to improve cervicogenic and RUE pain.   Post session, 5/10 NPS in R sided neck and head ache. L side no pain in head ache and neck.     PT Education - 01/21/21 1618    Education Details form/technique with exercises.    Person(s) Educated Patient    Methods Explanation;Demonstration;Tactile cues;Verbal cues    Comprehension Verbalized understanding;Returned demonstration            PT Short Term Goals - 12/25/20 1825      PT SHORT TERM GOAL #1   Title After 4 weeks pt to report 2-3 HEP actiivties to help with symptoms management with flareups.    Time 4    Period Weeks    Status New    Target Date 01/22/21      PT SHORT TERM GOAL #2   Title After 4 weeks pt to report successful plan for safe walking program performance.    Baseline fearful of vertigo episode whilst walking, has been avoiding extensive walking    Time 4    Period Weeks    Status New    Target Date 01/22/21             PT Long Term Goals - 01/07/21 1812      PT LONG TERM GOAL #1   Title After 8 weeks pt to show 8 point improvement on FOTO survery to reflect improved functional activity tolerance.    Baseline 4/27: 18/43    Time 8    Period Weeks    Status New    Target Date 02/19/21      PT LONG TERM GOAL #2   Title After 8 weeks demonstrate improved cervical ROM in rotation >65 degrees bilat without pain provocation.    Baseline ~45 degrees bilat with pain during Rt rotation    Time 8    Period Weeks    Status New      PT LONG TERM GOAL #3   Title After 12 weeks pt to report ability to perform 8 hour workday without symptoms exacerbation pain>2 numbers on NPRS.    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    Baseline Unable to use RUE for hair management, pain and guarded with dressing self    Time 12    Period Weeks    Status New  PT LONG TERM GOAL #5   Title Pt will display R grip strength equal to LUE to demonstrate clinically significant improvment from cervical radicular symptoms to perform basic ADL's such as opening doors, cans, turning steering wheel.    Baseline L: 79-80 Kg, R: 10 Kg    Time 12    Period Weeks    Status New                 Plan - 01/21/21 1902    Clinical Impression Statement Pt still demonstrating limited carryover from session to session with improvements in pain on R head aches and neck. Pt displays good carryover in session with ability to decrease pain significantly. MRI results reported minimal narrowing of cervical segments on R foramen. MRI results do not alter current PT POC. Pt displays most difficulty with cervical AROM and thoracic/shoulder exercises limited by R sided neck and shoulder pain. Pt will continue to benefit from skilled PT services in attempts to improve pain, head aches, and strength in RUE so pt can return to work full time.    Personal Factors and Comorbidities Age;Education;Time since onset of injury/illness/exacerbation;Past/Current Experience    Examination-Activity Limitations Bathing;Bed Mobility;Carry;Dressing;Hygiene/Grooming;Lift;Reach Overhead;Self Feeding    Examination-Participation Restrictions Meal Prep;Cleaning;Occupation;Driving;Community Activity;Interpersonal Relationship;Laundry;Yard Work;Shop    Stability/Clinical Decision Making Unstable/Unpredictable    Rehab Potential Good    PT Frequency 2x / week    PT Duration 12 weeks    PT Treatment/Interventions ADLs/Self Care Home Management;Cryotherapy;Electrical Stimulation;Moist Heat;DME Instruction;Therapeutic exercise;Balance training;Neuromuscular re-education;Therapeutic  activities;Functional mobility training;Patient/family education;Manual techniques;Passive range of motion;Dry needling;Vestibular    PT Next Visit Plan Progress postural muscle strength and RUE strength    PT Home Exercise Plan R UT stretch, supine cervical retractions, STM with tennis ball on wall on rhomboids and paraspinals. Levator scap stretch, scap retractions. Radial/median nerve glides    Consulted and Agree with Plan of Care Patient           Patient will benefit from skilled therapeutic intervention in order to improve the following deficits and impairments:  Abnormal gait,Decreased activity tolerance,Decreased balance,Decreased range of motion,Decreased strength,Hypomobility,Dizziness,Increased muscle spasms,Impaired flexibility,Postural dysfunction  Visit Diagnosis: Cervicalgia  Cervicogenic headache     Problem List There are no problems to display for this patient.   Delphia Grates. Fairly IV, PT, DPT Physical Therapist- [place]  [facility]  01/21/2021, 10:01 PM  [place] [facility] PHYSICAL AND SPORTS MEDICINE [address], [place], [address] Phone: [phone]   Fax:  [phone]  Name: Melanie Briggs MRN: [id number] Date of Birth: 02/04/1971

## 2021-01-26 ENCOUNTER — Other Ambulatory Visit: Payer: Self-pay

## 2021-01-26 ENCOUNTER — Ambulatory Visit: Payer: BC Managed Care – PPO

## 2021-01-26 DIAGNOSIS — G4486 Cervicogenic headache: Secondary | ICD-10-CM | POA: Diagnosis not present

## 2021-01-26 DIAGNOSIS — M542 Cervicalgia: Secondary | ICD-10-CM | POA: Diagnosis not present

## 2021-01-26 NOTE — Therapy (Signed)
[facility] PHYS[facility]ORTS MEDICINE [address], [place], [address] Phone: [phone]   Fax:  [phone]  Physical Therapy Treatment/Physical Therapy Progress Note   Dates of reporting period 12/25/20   to   01/26/21  Patient Details  Name: Melanie Briggs MRN: [id number] Date of Birth: 07-11-1971 Referring Provider (PT): Nilda Calamity PA-C   Encounter Date: 01/26/2021   PT End of Session - 01/26/21 1609    Visit Number 10    Number of Visits 17    Date for PT Re-Evaluation 02/19/21    Authorization Type BCBS COMM PRO    Authorization Time Period 12/25/20-03/19/21    PT Start Time 1602    PT Stop Time 1645    PT Time Calculation (min) 43 min    Activity Tolerance Patient tolerated treatment well    Behavior During Therapy [facility] for tasks assessed/performed           Past Medical History:  Diagnosis Date  . Bronchitis   . Depression   . Diabetes mellitus without complication (HCC)   . Hypertension   . Migraine     Past Surgical History:  Procedure Laterality Date  . ABDOMINAL HYSTERECTOMY    . TUMOR EXCISION N/A     There were no vitals filed for this visit.   Subjective Assessment - 01/26/21 1605    Subjective Pt reports she got her FCE scheduled. Reports she believes the weather is affecting her pain. R head ache pain is 9.5/10. 8/10 this morning. Able to hold resistance band with RUE but does have R dorsal forearm pain. No pain on L head and neck.    Pertinent History Melanie Briggs is a 49yoF who comes to [facility] for help with chronic persistent Rt headache associated with Rt neck pain in the suboccipital/upper trap distribution with more recent development of referral to the right dorsal forearm. Pt reports head/neck symptoms since successful resolution with combined PT and injections in 2018 at [facility], but returned as a daily phenomenon in January of 2021 around the time of the passing of her mother. Pt  reports the involvement of her dorsal forearm pain is new to this episode. Pt has head/neck/arm symptoms daily, but has 1-2x weekly vertiginous dizziness episodes with associated spinning sensation and visual phenomenon that would often last for entire day, now managed to ~45 minutes with use of meclezine therapy. Pt reports head/neck/arm symptoms are worse in morning, then improved hours into the day and thereafter. Pt is essentailly guarded and slow with ADL actiivty due to symptoms, but has clear worsening of pain with Rt cervical rotation, cervical extension, and Right cervical sidebending. Recent MRI unremarkable, MRI in 2018 for same problem also unremarkable.    Limitations Sitting;Reading    How long can you sit comfortably? ~4 hours for work    How long can you stand comfortably? WNL    How long can you walk comfortably? WNL    Diagnostic tests MRI    Patient Stated Goals return to full time at work    Currently in Pain? Yes    Pain Score 10-Worst pain ever    Pain Location Neck    Pain Orientation Right    Pain Descriptors / Indicators Aching    Pain Type Chronic pain    Pain Onset More than a month ago          There.ex:   Informal reassessment of goals to view pt progress in  therapy. Please review below and in clinical impression for details.    Short term goals:    Reports 2-3 HEP activities helping symptom management   Walking program?  Been walking? Yes at [facility] 1x/week   Long term goals:    FOTO: 30/45 Cervical AROM: 30 deg L rotation and 40 deg with R rotation    RUE use for ADL tasks? Self grooming? Dressing? Door knobs? Able to open and close door doors with RUE. Difficulty with bathing, grooming, and dressing ADL's requiring significant other assist.    Grip strength (R/L) in KG: Next session  Standing cone stacks with RUE on third shelf heigh for functional overhead strength:   3x 12 cones stacking 3 cones high    Manual Therapy:   Pt in supine for 10 min.  Utilization of stretches to improve pain and flexibility and muscles in neck.   Upper trap stretch with passive contralateral rotation and overpressure at shoulder: 5x30sec/side   [place] stretch bilaterally: 3x30 sec/side.      Patient's condition has the potential to improve in response to therapy. Maximum improvement is yet to be obtained. The anticipated improvement is attainable and reasonable in a generally predictable time.    PT Education - 01/26/21 1609    Education Details form/tehcnique with exercises.    Person(s) Educated Patient    Methods Explanation;Demonstration;Tactile cues;Verbal cues    Comprehension Verbalized understanding;Returned demonstration            PT Short Term Goals - 12/25/20 1825      PT SHORT TERM GOAL #1   Title After 4 weeks pt to report 2-3 HEP actiivties to help with symptoms management with flareups.    Time 4    Period Weeks    Status New    Target Date 01/22/21      PT SHORT TERM GOAL #2   Title After 4 weeks pt to report successful plan for safe walking program performance.    Baseline fearful of vertigo episode whilst walking, has been avoiding extensive walking    Time 4    Period Weeks    Status New    Target Date 01/22/21             PT Long Term Goals - 01/07/21 1812      PT LONG TERM GOAL #1   Title After 8 weeks pt to show 8 point improvement on FOTO survery to reflect improved functional activity tolerance.    Baseline 4/27: 18/43    Time 8    Period Weeks    Status New    Target Date 02/19/21      PT LONG TERM GOAL #2   Title After 8 weeks demonstrate improved cervical ROM in rotation >65 degrees bilat without pain provocation.    Baseline ~45 degrees bilat with pain during Rt rotation    Time 8    Period Weeks    Status New      PT LONG TERM GOAL #3   Title After 12 weeks pt to report ability to perform 8 hour workday without symptoms exacerbation pain>2 numbers on NPRS.    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  Baseline Unable to use RUE for hair management, pain and guarded with dressing self    Time 12    Period Weeks    Status New      PT LONG TERM GOAL #5   Title Pt will display R grip strength equal to LUE to demonstrate clinically significant improvment from cervical radicular symptoms to perform basic ADL's such as opening doors, cans, turning steering wheel.    Baseline L: 79-80 Kg, R: 10 Kg    Time 12    Period Weeks    Status New                 Plan - 01/26/21 2025    Clinical Impression Statement Pt is making progress towards her goals. She has found herentire HEP grossly helpful in managing her symptom flare ups and reports ambulating 1x/week at [place] outlets denying dizziness or vertigo symptoms. Pt has also progressed from 18 to 30 on her FOTO score indicating improvements in her functional mobility. Pt has had regression in her cervical AROM in rotation displaying 30 degrees to the L and 40 degress to the R and still reports significant difficulty with utilizing RUE for grooming, bathing, dressing ADL's requiring assistance from significant other. Although pt is progresisng towards goals, pt has yet to meet maximal potential of PT benefits and will continue to benefit from skilled PT services to address these impairments so pt can return to PLOF.    Personal Factors and Comorbidities Age;Education;Time since onset of injury/illness/exacerbation;Past/Current Experience    Examination-Activity Limitations Bathing;Bed Mobility;Carry;Dressing;Hygiene/Grooming;Lift;Reach Overhead;Self Feeding    Examination-Participation Restrictions Meal Prep;Cleaning;Occupation;Driving;Community Activity;Interpersonal Relationship;Laundry;Yard Work;Shop     Stability/Clinical Decision Making Unstable/Unpredictable    Rehab Potential Good    PT Frequency 2x / week    PT Duration 12 weeks    PT Treatment/Interventions ADLs/Self Care Home Management;Cryotherapy;Electrical Stimulation;Moist Heat;DME Instruction;Therapeutic exercise;Balance training;Neuromuscular re-education;Therapeutic activities;Functional mobility training;Patient/family education;Manual techniques;Passive range of motion;Dry needling;Vestibular    PT Next Visit Plan Progress postural muscle strength and RUE strength    PT Home Exercise Plan R UT stretch, supine cervical retractions, STM with tennis ball on wall on rhomboids and paraspinals. Levator scap stretch, scap retractions. Radial/median nerve glides    Consulted and Agree with Plan of Care Patient           Patient will benefit from skilled therapeutic intervention in order to improve the following deficits and impairments:  Abnormal gait,Decreased activity tolerance,Decreased balance,Decreased range of motion,Decreased strength,Hypomobility,Dizziness,Increased muscle spasms,Impaired flexibility,Postural dysfunction  Visit Diagnosis: Cervicalgia  Cervicogenic headache     Problem List There are no problems to display for this patient.   Delphia Grates. Fairly IV, PT, DPT Physical Therapist- [place]  [facility]  01/26/2021, 8:43 PM  [facility] PHYSICAL AND SPORTS MEDICINE [address] [place], [address] Phone: [phone]   Fax:  [phone]  Name: Melanie Briggs MRN: [id number] Date of Birth: 29-Jul-1971

## 2021-01-28 ENCOUNTER — Ambulatory Visit: Payer: BC Managed Care – PPO

## 2021-01-28 ENCOUNTER — Other Ambulatory Visit: Payer: Self-pay

## 2021-01-28 DIAGNOSIS — M542 Cervicalgia: Secondary | ICD-10-CM | POA: Diagnosis not present

## 2021-01-28 DIAGNOSIS — G4486 Cervicogenic headache: Secondary | ICD-10-CM

## 2021-01-28 NOTE — Therapy (Signed)
[facility] PH[facility] MEDICINE [address] [place], [address] Phone: [phone]   Fax:  [phone]  Physical Therapy Treatment  Patient Details  Name: Melanie Briggs MRN: [id number] Date of Birth: 03-Aug-1971 Referring Provider (PT): Nilda Calamity PA-C   Encounter Date: 01/28/2021   PT End of Session - 01/28/21 1434    Visit Number 11    Number of Visits 17    Date for PT Re-Evaluation 02/19/21    Authorization Type BCBS COMM PRO    Authorization Time Period 12/25/20-03/19/21    PT Start Time 1430    PT Stop Time 1515    PT Time Calculation (min) 45 min    Activity Tolerance Patient tolerated treatment well;No increased pain    Behavior During Therapy WFL for tasks assessed/performed           Past Medical History:  Diagnosis Date  . Bronchitis   . Depression   . Diabetes mellitus without complication (HCC)   . Hypertension   . Migraine     Past Surgical History:  Procedure Laterality Date  . ABDOMINAL HYSTERECTOMY    . TUMOR EXCISION N/A     There were no vitals filed for this visit.   Subjective Assessment - 01/28/21 1430    Subjective Pt reports ability to catch a glass that was falling off of her washing machine with her R hand. Did however cause an increase in her RUE pain last night. Utilized Voltaren gel which helped. Current pain recorded at 9/10 NPS in headache and neck on R side. RUE is recorded at 12/10 NPS.    Pertinent History Melanie Briggs is a 49yoF who comes to [facility] for help with chronic persistent Rt headache associated with Rt neck pain in the suboccipital/upper trap distribution with more recent development of referral to the right dorsal forearm. Pt reports head/neck symptoms since successful resolution with combined PT and injections in 2018 at [facility], but returned as a daily phenomenon in January of 2021 around the time of the passing of her mother. Pt reports the involvement of  her dorsal forearm pain is new to this episode. Pt has head/neck/arm symptoms daily, but has 1-2x weekly vertiginous dizziness episodes with associated spinning sensation and visual phenomenon that would often last for entire day, now managed to ~45 minutes with use of meclezine therapy. Pt reports head/neck/arm symptoms are worse in morning, then improved hours into the day and thereafter. Pt is essentailly guarded and slow with ADL actiivty due to symptoms, but has clear worsening of pain with Rt cervical rotation, cervical extension, and Right cervical sidebending. Recent MRI unremarkable, MRI in 2018 for same problem also unremarkable.    Limitations Sitting;Reading    How long can you sit comfortably? ~4 hours for work    How long can you stand comfortably? WNL    How long can you walk comfortably? WNL    Diagnostic tests MRI    Patient Stated Goals return to full time at work    Currently in Pain? Yes    Pain Score 9     Pain Location Neck    Pain Orientation Right    Pain Descriptors / Indicators Aching    Pain Type Chronic pain    Pain Onset More than a month ago           Manual Therapy:   25 min spent with pt in supine.    B UT stretch: 3x30  sec for improved tissue extensibility and AROM     B scalene stretch: 3x30 sec for improved tissue extensibility and AROM    Suboccipital release 7 min for pain modulation due to cervicogenic head aches    Prone cervical UPA's bilaterally, 1x30 sec, grade 2 for pain modulation bilaterally C2-C7    Prone cervical UPA's bilaterally, 1x30 sec, grade 3 for improved AROM.    Cervical Rotation R/L:   45 deg/40 deg post manual therapy. Prior session pt's AROM was 40/30 deg (R/L)    There.ex:  Seated cervical AROM in rotation bilaterally. 2x10/direction.  Pt educated on seated suboccipital stretch: 1x30 sec   Standing functional cone stacking with RUE for strengthening. Postural education and reassessment to see if improved posture improves  symptoms. 4x8 cones on 3rd shelf. VC's for scap retraction and chin tuck to improve stability and pain modulation with RUE cone stacks. Pt reports improvement in RUE symptoms from 12/10 NPS to 9/10 NPS after cone stacking.    PT Education - 01/28/21 1433    Education Details form/technique with exercises.    Person(s) Educated Patient    Methods Demonstration;Explanation;Tactile cues;Verbal cues    Comprehension Verbalized understanding;Returned demonstration            PT Short Term Goals - 12/25/20 1825      PT SHORT TERM GOAL #1   Title After 4 weeks pt to report 2-3 HEP actiivties to help with symptoms management with flareups.    Time 4    Period Weeks    Status New    Target Date 01/22/21      PT SHORT TERM GOAL #2   Title After 4 weeks pt to report successful plan for safe walking program performance.    Baseline fearful of vertigo episode whilst walking, has been avoiding extensive walking    Time 4    Period Weeks    Status New    Target Date 01/22/21             PT Long Term Goals - 01/07/21 1812      PT LONG TERM GOAL #1   Title After 8 weeks pt to show 8 point improvement on FOTO survery to reflect improved functional activity tolerance.    Baseline 4/27: 18/43    Time 8    Period Weeks    Status New    Target Date 02/19/21      PT LONG TERM GOAL #2   Title After 8 weeks demonstrate improved cervical ROM in rotation >65 degrees bilat without pain provocation.    Baseline ~45 degrees bilat with pain during Rt rotation    Time 8    Period Weeks    Status New      PT LONG TERM GOAL #3   Title After 12 weeks pt to report ability to perform 8 hour workday without symptoms exacerbation pain>2 numbers on NPRS.    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    Baseline Unable to use RUE for hair management, pain and guarded with dressing self    Time 12    Period Weeks    Status New      PT  LONG TERM GOAL #5   Title Pt will display R grip strength equal to LUE to demonstrate clinically significant improvment from cervical radicular symptoms to perform basic ADL's such as opening doors, cans, turning steering wheel.    Baseline L: 79-80 Kg, R: 10 Kg    Time 12    Period Weeks    Status New                 Plan - 01/28/21 1521    Clinical Impression Statement From previous session, pt displayed clinically significant improvement in cervical rotation AROM. PT educated on continuing cervical mobility exercises to progress mobility and modulate cervical and R head ache symptoms. Post session pt improved pain in R head ache and neck pain from 9/10 to 6/10 NPS and RUE pain from 12/10 NPS to 9/10 NPS. Pt educated on postural techniques to improve pain with RUE overhead function. Pt will continue to benefit from skilled PT services to progress RUE function, pain in R headaches/neck so pt can return to PLOF.    Personal Factors and Comorbidities Age;Education;Time since onset of injury/illness/exacerbation;Past/Current Experience    Examination-Activity Limitations Bathing;Bed Mobility;Carry;Dressing;Hygiene/Grooming;Lift;Reach Overhead;Self Feeding    Examination-Participation Restrictions Meal Prep;Cleaning;Occupation;Driving;Community Activity;Interpersonal Relationship;Laundry;Yard Work;Shop    Stability/Clinical Decision Making Unstable/Unpredictable    Rehab Potential Good    PT Frequency 2x / week    PT Duration 12 weeks    PT Treatment/Interventions ADLs/Self Care Home Management;Cryotherapy;Electrical Stimulation;Moist Heat;DME Instruction;Therapeutic exercise;Balance training;Neuromuscular re-education;Therapeutic activities;Functional mobility training;Patient/family education;Manual techniques;Passive range of  motion;Dry needling;Vestibular    PT Next Visit Plan Progress postural muscle strength, cervical mobility, and RUE strength    PT Home Exercise Plan R UT stretch, supine cervical retractions, STM with tennis ball on wall on rhomboids and paraspinals. Levator scap stretch, scap retractions. Radial/median nerve glides, suboccipital stretch.    Consulted and Agree with Plan of Care Patient           Patient will benefit from skilled therapeutic intervention in order to improve the following deficits and impairments:  Abnormal gait,Decreased activity tolerance,Decreased balance,Decreased range of motion,Decreased strength,Hypomobility,Dizziness,Increased muscle spasms,Impaired flexibility,Postural dysfunction  Visit Diagnosis: Cervicalgia  Cervicogenic headache     Problem List There are no problems to display for this patient.   Delphia Grates. Fairly IV, PT, DPT Physical Therapist- [place]  [facility]  01/28/2021, 3:31 PM  [facility] PHYSICAL AND SPORTS MEDICINE [address] [place], [address] Phone: [phone]   Fax:  [phone]  Name: Janissa Bertram MRN: [id number] Date of Birth: Apr 05, 1971

## 2021-01-29 ENCOUNTER — Telehealth: Payer: Self-pay

## 2021-01-29 ENCOUNTER — Other Ambulatory Visit: Payer: Self-pay

## 2021-01-29 DIAGNOSIS — F5101 Primary insomnia: Secondary | ICD-10-CM

## 2021-01-29 DIAGNOSIS — E119 Type 2 diabetes mellitus without complications: Secondary | ICD-10-CM

## 2021-01-29 MED ORDER — TRAZODONE HCL 50 MG PO TABS: 50.0000 mg | ORAL_TABLET | Freq: Every day | ORAL | 0 refills | Status: DC

## 2021-01-29 MED ORDER — DAPAGLIFLOZIN PROPANEDIOL 10 MG PO TABS: 10.0000 mg | ORAL_TABLET | Freq: Every day | ORAL | 0 refills | Status: DC

## 2021-01-29 NOTE — Telephone Encounter (Signed)
lmom  That we send trazodone 50 mg 1 tab po daily for 90 days and keep follow up appt

## 2021-02-03 ENCOUNTER — Other Ambulatory Visit: Payer: Self-pay

## 2021-02-03 ENCOUNTER — Ambulatory Visit: Payer: BC Managed Care – PPO

## 2021-02-04 ENCOUNTER — Other Ambulatory Visit: Payer: Self-pay

## 2021-02-04 DIAGNOSIS — E119 Type 2 diabetes mellitus without complications: Secondary | ICD-10-CM

## 2021-02-04 MED ORDER — DAPAGLIFLOZIN PROPANEDIOL 10 MG PO TABS: 10.0000 mg | ORAL_TABLET | Freq: Every day | ORAL | 1 refills | Status: DC

## 2021-02-05 ENCOUNTER — Ambulatory Visit: Payer: BC Managed Care – PPO | Admitting: Physician Assistant

## 2021-02-05 ENCOUNTER — Ambulatory Visit: Payer: BC Managed Care – PPO

## 2021-02-05 ENCOUNTER — Other Ambulatory Visit: Payer: Self-pay

## 2021-02-05 DIAGNOSIS — M542 Cervicalgia: Secondary | ICD-10-CM | POA: Diagnosis not present

## 2021-02-05 DIAGNOSIS — G4486 Cervicogenic headache: Secondary | ICD-10-CM | POA: Diagnosis not present

## 2021-02-05 NOTE — Therapy (Signed)
[facility] PH[facility]TS MEDICINE [address], [place], [address] Phone: [phone]   Fax:  [phone]  Physical Therapy Treatment  Patient Details  Name: Melanie Briggs MRN: [id number] Date of Birth: 03-17-1971 Referring Provider (PT): Nilda Calamity PA-C   Encounter Date: 02/05/2021   PT End of Session - 02/05/21 1819    Visit Number 12    Number of Visits 17    Date for PT Re-Evaluation 02/19/21    Authorization Type BCBS COMM PRO    Authorization Time Period 12/25/20-03/19/21    PT Start Time 1602    PT Stop Time 1646    PT Time Calculation (min) 44 min    Activity Tolerance Patient tolerated treatment well;No increased pain    Behavior During Therapy WFL for tasks assessed/performed           Past Medical History:  Diagnosis Date  . Bronchitis   . Depression   . Diabetes mellitus without complication (HCC)   . Hypertension   . Migraine     Past Surgical History:  Procedure Laterality Date  . ABDOMINAL HYSTERECTOMY    . TUMOR EXCISION N/A     There were no vitals filed for this visit.   Subjective Assessment - 02/05/21 1817    Subjective Pt reports aunt suddently placed in hospice and has had yet another increase in pain due to emotional stress from ill fmaily member. Has been having a difficult time since tuesday emotionally being there for her family and processing her emotions. Does report whole weekend pt had significant improvements in her headaches not reaching an 8/10 NPS the whole weekend but after new family update pt currently having 14/10 pain in R head ache and 12/10 NPS in L head ache.    Pertinent History Briteny Briggs is a 49yoF who comes to [facility] for help with chronic persistent Rt headache associated with Rt neck pain in the suboccipital/upper trap distribution with more recent development of referral to the right dorsal forearm. Pt reports head/neck symptoms since successful resolution with  combined PT and injections in 2018 at [facility], but returned as a daily phenomenon in January of 2021 around the time of the passing of her mother. Pt reports the involvement of her dorsal forearm pain is new to this episode. Pt has head/neck/arm symptoms daily, but has 1-2x weekly vertiginous dizziness episodes with associated spinning sensation and visual phenomenon that would often last for entire day, now managed to ~45 minutes with use of meclezine therapy. Pt reports head/neck/arm symptoms are worse in morning, then improved hours into the day and thereafter. Pt is essentailly guarded and slow with ADL actiivty due to symptoms, but has clear worsening of pain with Rt cervical rotation, cervical extension, and Right cervical sidebending. Recent MRI unremarkable, MRI in 2018 for same problem also unremarkable.    Limitations Sitting;Reading    How long can you sit comfortably? ~4 hours for work    How long can you stand comfortably? WNL    How long can you walk comfortably? WNL    Diagnostic tests MRI    Patient Stated Goals return to full time at work    Currently in Pain? Yes    Pain Onset More than a month ago            Manual therapy with pt in supine:   32 min of STM and cervical stretching to improve tension head aches bilaterally.   10  min STM to suboccipitals  5 min total of manual cervical traction   10 min STM on bilateral cervical paraspinals.   7 min total of B UT stretch with overpressure at shoulders and ant neck flexor stretching for pain modulation and improved cervical mobility.    There.ex:   Functional cone stacking. X10, 7 cones. VC's for postural correction for reduced pain. Improved motor control and appears less difficult and painful compared to previous attempts. Pt reports decrease in RUE pain after stacking.   Standing wrist extensor stretch: 2x30 sec. PT demo and education how to perform at home.   Pt educated on at home strategies in attempts to  improve R hand strength.    PT Education - 02/05/21 1818    Education Details form/technique with exercise. Wrist extensor stretch.    Person(s) Educated Patient    Methods Explanation;Demonstration;Tactile cues;Verbal cues    Comprehension Verbalized understanding;Returned demonstration            PT Short Term Goals - 12/25/20 1825      PT SHORT TERM GOAL #1   Title After 4 weeks pt to report 2-3 HEP actiivties to help with symptoms management with flareups.    Time 4    Period Weeks    Status New    Target Date 01/22/21      PT SHORT TERM GOAL #2   Title After 4 weeks pt to report successful plan for safe walking program performance.    Baseline fearful of vertigo episode whilst walking, has been avoiding extensive walking    Time 4    Period Weeks    Status New    Target Date 01/22/21             PT Long Term Goals - 01/07/21 1812      PT LONG TERM GOAL #1   Title After 8 weeks pt to show 8 point improvement on FOTO survery to reflect improved functional activity tolerance.    Baseline 4/27: 18/43    Time 8    Period Weeks    Status New    Target Date 02/19/21      PT LONG TERM GOAL #2   Title After 8 weeks demonstrate improved cervical ROM in rotation >65 degrees bilat without pain provocation.    Baseline ~45 degrees bilat with pain during Rt rotation    Time 8    Period Weeks    Status New      PT LONG TERM GOAL #3   Title After 12 weeks pt to report ability to perform 8 hour workday without symptoms exacerbation pain>2 numbers on NPRS.    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    Baseline Unable to use RUE for hair management, pain and guarded with dressing self    Time 12    Period Weeks    Status New      PT LONG TERM GOAL #5    Title Pt will display R grip strength equal to LUE to demonstrate clinically significant improvment from cervical radicular symptoms to perform basic ADL's such as opening doors, cans, turning steering wheel.    Baseline L: 79-80 Kg, R: 10 Kg  Time 12    Period Weeks    Status New                 Plan - 02/05/21 1819    Clinical Impression Statement Pt having flare up of symptoms due to family member being placed in hospice. Use of quiet dark room and manual therapy techniques for pain modulation for this session. Iproved R head ache from 14/10 NPS to 9/10 NPS and L head ache from 12/10 NPS to 9/10 NPS. Better ability to complete functional cone stacking for RUE strength with improvements in RUE pain from subjective reports. Pt educated on wrist extensor stretch due to reports of consisent dorsla forearm pain with typing and use of mouse on computer. PT will continue to attempt to improve pain, mobility and strength so pt can return to work and be independent with ADL completion.    Personal Factors and Comorbidities Age;Education;Time since onset of injury/illness/exacerbation;Past/Current Experience    Examination-Activity Limitations Bathing;Bed Mobility;Carry;Dressing;Hygiene/Grooming;Lift;Reach Overhead;Self Feeding    Examination-Participation Restrictions Meal Prep;Cleaning;Occupation;Driving;Community Activity;Interpersonal Relationship;Laundry;Yard Work;Shop    Stability/Clinical Decision Making Unstable/Unpredictable    Rehab Potential Good    PT Frequency 2x / week    PT Duration 12 weeks    PT Treatment/Interventions ADLs/Self Care Home Management;Cryotherapy;Electrical Stimulation;Moist Heat;DME Instruction;Therapeutic exercise;Balance training;Neuromuscular re-education;Therapeutic activities;Functional mobility training;Patient/family education;Manual techniques;Passive range of motion;Dry needling;Vestibular    PT Next Visit Plan Progress postural muscle strength,  cervical mobility, and RUE strength    PT Home Exercise Plan R UT stretch, supine cervical retractions, STM with tennis ball on wall on rhomboids and paraspinals. Levator scap stretch, scap retractions. Radial/median nerve glides, suboccipital stretch.    Consulted and Agree with Plan of Care Patient           Patient will benefit from skilled therapeutic intervention in order to improve the following deficits and impairments:  Abnormal gait,Decreased activity tolerance,Decreased balance,Decreased range of motion,Decreased strength,Hypomobility,Dizziness,Increased muscle spasms,Impaired flexibility,Postural dysfunction  Visit Diagnosis: Cervicalgia  Cervicogenic headache     Problem List There are no problems to display for this patient.   Delphia Grates. Fairly IV, PT, DPT Physical Therapist- [place]  [facility]  02/05/2021, 6:23 PM  [place] [facility] PHYSICAL AND SPORTS MEDICINE [address] [place], [address] Phone: [phone]   Fax:  [phone]  Name: Charrisse Masley MRN: [id number] Date of Birth: Mar 24, 1971

## 2021-02-11 ENCOUNTER — Ambulatory Visit: Payer: BC Managed Care – PPO | Attending: Physician Assistant

## 2021-02-11 ENCOUNTER — Other Ambulatory Visit: Payer: Self-pay

## 2021-02-11 DIAGNOSIS — G4486 Cervicogenic headache: Secondary | ICD-10-CM | POA: Diagnosis not present

## 2021-02-11 DIAGNOSIS — M542 Cervicalgia: Secondary | ICD-10-CM | POA: Diagnosis not present

## 2021-02-11 NOTE — Therapy (Signed)
[facility] PHYS[facility] SPORTS MEDICINE [address] [place], [address] Phone: [phone]   Fax:  [phone]  Physical Therapy Treatment  Patient Details  Name: Melanie Briggs MRN: [id number] Date of Birth: Feb 09, 1971 Referring Provider (PT): Nilda Calamity PA-C   Encounter Date: 02/11/2021   PT End of Session - 02/11/21 1524    Visit Number 13    Number of Visits 17    Date for PT Re-Evaluation 02/19/21    Authorization Type BCBS COMM PRO    Authorization Time Period 12/25/20-03/19/21    PT Start Time 1430    PT Stop Time 1515    PT Time Calculation (min) 45 min    Activity Tolerance Patient tolerated treatment well;No increased pain    Behavior During Therapy WFL for tasks assessed/performed           Past Medical History:  Diagnosis Date  . Bronchitis   . Depression   . Diabetes mellitus without complication (HCC)   . Hypertension   . Migraine     Past Surgical History:  Procedure Laterality Date  . ABDOMINAL HYSTERECTOMY    . TUMOR EXCISION N/A     There were no vitals filed for this visit.   Subjective Assessment - 02/11/21 1430    Subjective Pt reports attempting to stand from bed and pushing off of her RUE and slipped due to limited strength causing her to straining her RUE from elbow down. Reports good weekend with no pain above a 10/10. Current pain recorded R sided head ache is 9/10 and RUE at 9/10 NPS.    Pertinent History Melanie Briggs is a 49yoF who comes to [facility] for help with chronic persistent Rt headache associated with Rt neck pain in the suboccipital/upper trap distribution with more recent development of referral to the right dorsal forearm. Pt reports head/neck symptoms since successful resolution with combined PT and injections in 2018 at [facility], but returned as a daily phenomenon in January of 2021 around the time of the passing of her mother. Pt reports the involvement of her dorsal  forearm pain is new to this episode. Pt has head/neck/arm symptoms daily, but has 1-2x weekly vertiginous dizziness episodes with associated spinning sensation and visual phenomenon that would often last for entire day, now managed to ~45 minutes with use of meclezine therapy. Pt reports head/neck/arm symptoms are worse in morning, then improved hours into the day and thereafter. Pt is essentailly guarded and slow with ADL actiivty due to symptoms, but has clear worsening of pain with Rt cervical rotation, cervical extension, and Right cervical sidebending. Recent MRI unremarkable, MRI in 2018 for same problem also unremarkable.    Limitations Sitting;Reading    How long can you sit comfortably? ~4 hours for work    How long can you stand comfortably? WNL    How long can you walk comfortably? WNL    Diagnostic tests MRI    Patient Stated Goals return to full time at work    Currently in Pain? Yes    Pain Score 9     Pain Orientation Right    Pain Descriptors / Indicators Aching    Pain Type Chronic pain    Pain Onset More than a month ago           Manual Therapy:              15 min spent with pt in supine.  R UT stretch: 3 min total for improved tissue extensibility and AROM with L rotation for added stretch                                    R scalene stretch: 3x30 sec for improved tissue extensibility and AROM with added L rotation for added stretch.                         Suboccipital release 5 min for pain modulation due to cervicogenic head aches                          Supine cervical joint down glides bilaterally. Grade 3 for improve cervical AROM. 2x5 sec bouts C2-C7.                         Supine R elbow mulligan MWM technique. 2x10 reps, 5 sec isometric hold.  Cervical Rotation R/L:            AROM 65/ 60 deg post manual therapy.             Prior session pt's AROM was 60/50 deg (R/L).      Final pain: R head ache 6/10 NPS. RUE pain 4/10 NPS and  pain free at hand and lateral shoulder proximally.      There.ex:    AAROM shoulder elevation to improve RUE strength: x10 with 1 KG med ball     Standing ulnar deviation with supinated grip squeezing lacrosse ball: 2x10 reps. Pain with pronated grip and ulnar deviation with mouse tasks on computer. Pt educated to perform supinated ulnar deviation in pain free range to improve pain in R forearm.      Cone stacking RUE: 11 cones x3 reps. No pain in RUE compared to previous session.    Standing RUE radial nerve glide. Reassessed due ot pt reporting they were causing her more pain. Was performing tensioner versus flossing technique. Mod to max TC's at head and R wrist for sequencing technique. Educated pt to perform in front of mirror to reinforce appropriate sequencing to maximize results. 2x10     PT Education - 02/11/21 1523    Education Details form/technique with exercises.    Person(s) Educated Patient    Methods Explanation;Demonstration;Tactile cues;Verbal cues    Comprehension Verbalized understanding;Returned demonstration            PT Short Term Goals - 12/25/20 1825      PT SHORT TERM GOAL #1   Title After 4 weeks pt to report 2-3 HEP actiivties to help with symptoms management with flareups.    Time 4    Period Weeks    Status New    Target Date 01/22/21      PT SHORT TERM GOAL #2   Title After 4 weeks pt to report successful plan for safe walking program performance.    Baseline fearful of vertigo episode whilst walking, has been avoiding extensive walking    Time 4    Period Weeks    Status New    Target Date 01/22/21             PT Long Term Goals - 01/07/21 1812      PT LONG TERM GOAL #1   Title After 8 weeks pt to show 8 point improvement on  FOTO survery to reflect improved functional activity tolerance.    Baseline 4/27: 18/43    Time 8    Period Weeks    Status New    Target Date 02/19/21      PT LONG TERM GOAL #2   Title After 8 weeks  demonstrate improved cervical ROM in rotation >65 degrees bilat without pain provocation.    Baseline ~45 degrees bilat with pain during Rt rotation    Time 8    Period Weeks    Status New      PT LONG TERM GOAL #3   Title After 12 weeks pt to report ability to perform 8 hour workday without symptoms exacerbation pain>2 numbers on NPRS.    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    Baseline Unable to use RUE for hair management, pain and guarded with dressing self    Time 12    Period Weeks    Status New      PT LONG TERM GOAL #5   Title Pt will display R grip strength equal to LUE to demonstrate clinically significant improvment from cervical radicular symptoms to perform basic ADL's such as opening doors, cans, turning steering wheel.    Baseline L: 79-80 Kg, R: 10 Kg    Time 12    Period Weeks    Status New                 Plan - 02/11/21 1526    Clinical Impression Statement Due to pt subjective reports pt has had improvements in her head ache and radicualr pain over the weekend with pain not geting above a 10/10 NPS compared to 12-13/10. Pt improved R head ache pain and RUE pain from 9/10 NPS to 6/10 NPS in cervical region and 4/10 NPS with localized pain at lat epicondyle and no longer having full RUE pain. PT reassessed Radial nerve glide. Pt also progressing in cervical rotation motion. Pt performing tensioner technique versus flossing causing her an increase in RUE pain. Pt educated on differences in tehcniques and reported less pain. Due to muscular reported pain at lat epicondyle attempted mulligan MWM technique and pt reported improvement in pain. PT will continue to benefit from skilled services to progress mobility, strength, and pain in head aches  and RUE so pt can return to PLOF.    Personal Factors and Comorbidities Age;Education;Time since onset of injury/illness/exacerbation;Past/Current Experience    Examination-Activity Limitations Bathing;Bed Mobility;Carry;Dressing;Hygiene/Grooming;Lift;Reach Overhead;Self Feeding    Examination-Participation Restrictions Meal Prep;Cleaning;Occupation;Driving;Community Activity;Interpersonal Relationship;Laundry;Yard Work;Shop    Stability/Clinical Decision Making Unstable/Unpredictable    Rehab Potential Good    PT Frequency 2x / week    PT Duration 12 weeks    PT Treatment/Interventions ADLs/Self Care Home Management;Cryotherapy;Electrical Stimulation;Moist Heat;DME Instruction;Therapeutic exercise;Balance training;Neuromuscular re-education;Therapeutic activities;Functional mobility training;Patient/family education;Manual techniques;Passive range of motion;Dry needling;Vestibular    PT Next Visit Plan Progress postural muscle strength, cervical mobility, and RUE strength    PT Home Exercise Plan R UT stretch, supine cervical retractions, STM with tennis ball on wall on rhomboids and paraspinals. Levator scap stretch, scap retractions. Radial/median nerve glides, suboccipital stretch.    Consulted and Agree with Plan of Care Patient  Patient will benefit from skilled therapeutic intervention in order to improve the following deficits and impairments:  Abnormal gait,Decreased activity tolerance,Decreased balance,Decreased range of motion,Decreased strength,Hypomobility,Dizziness,Increased muscle spasms,Impaired flexibility,Postural dysfunction  Visit Diagnosis: Cervicogenic headache  Cervicalgia     Problem List There are no problems to display for this patient.   Delphia Grates. Fairly IV, PT, DPT Physical Therapist- [place]  [facility]  02/11/2021, 3:54 PM  [facility] PHYSICAL AND SPORTS MEDICINE [address] [place], [address] Phone: [phone]   Fax:  [phone]  Name: Melanie Briggs MRN: [id number] Date of Birth: 1971-05-22

## 2021-02-13 ENCOUNTER — Ambulatory Visit: Payer: BC Managed Care – PPO | Admitting: Physician Assistant

## 2021-02-17 ENCOUNTER — Other Ambulatory Visit: Payer: Self-pay

## 2021-02-17 ENCOUNTER — Ambulatory Visit: Payer: BC Managed Care – PPO

## 2021-02-17 DIAGNOSIS — G4486 Cervicogenic headache: Secondary | ICD-10-CM | POA: Diagnosis not present

## 2021-02-17 DIAGNOSIS — M542 Cervicalgia: Secondary | ICD-10-CM

## 2021-02-17 NOTE — Therapy (Signed)
[facility] [address], [place], [address] Phone: [phone]   Fax:  [phone]  Physical Therapy Treatment  Patient Details  Name: Melanie Briggs MRN: [id number] Date of Birth: 04-Oct-1970 Referring Provider (PT): Nilda Calamity PA-C   Encounter Date: 02/17/2021   PT End of Session - 02/17/21 1700    Visit Number 14    Number of Visits 17    Date for PT Re-Evaluation 02/19/21    Authorization Type [employer]BS COMM PRO    Authorization Time Period 12/25/20-03/19/21    PT Start Time 1602    PT Stop Time 1645    PT Time Calculation (min) 43 min    Activity Tolerance Patient tolerated treatment well;No increased pain    Behavior During Therapy WFL for tasks assessed/performed           Past Medical History:  Diagnosis Date  . Bronchitis   . Depression   . Diabetes mellitus without complication (HCC)   . Hypertension   . Migraine     Past Surgical History:  Procedure Laterality Date  . ABDOMINAL HYSTERECTOMY    . TUMOR EXCISION N/A     There were no vitals filed for this visit.   Subjective Assessment - 02/17/21 1604    Subjective Pt reports headache at 8/10 NPS on R side. Neck pain 5/10 NPS. RUE pain 9/10 NPS.    Pertinent History Melanie Briggs is a 49yoF who comes to [facility] for help with chronic persistent Rt headache associated with Rt neck pain in the suboccipital/upper trap distribution with more recent development of referral to the right dorsal forearm. Pt reports head/neck symptoms since successful resolution with combined PT and injections in 2018 at [facility], but returned as a daily phenomenon in January of 2021 around the time of the passing of her mother. Pt reports the involvement of her dorsal forearm pain is new to this episode. Pt has head/neck/arm symptoms daily, but has 1-2x weekly vertiginous dizziness episodes with associated spinning sensation and visual phenomenon that would  often last for entire day, now managed to ~45 minutes with use of meclezine therapy. Pt reports head/neck/arm symptoms are worse in morning, then improved hours into the day and thereafter. Pt is essentailly guarded and slow with ADL actiivty due to symptoms, but has clear worsening of pain with Rt cervical rotation, cervical extension, and Right cervical sidebending. Recent MRI unremarkable, MRI in 2018 for same problem also unremarkable.    Limitations Sitting;Reading    How long can you sit comfortably? ~4 hours for work    How long can you stand comfortably? WNL    How long can you walk comfortably? WNL    Diagnostic tests MRI    Patient Stated Goals return to full time at work    Currently in Pain? Yes    Pain Score 8     Pain Location Head    Pain Orientation Right    Pain Descriptors / Indicators Aching    Pain Type Chronic pain    Pain Onset More than a month ago          Manual Therapy:              20 min spent with pt in supine.                          R UT stretch: 5 min total for improved tissue  extensibility and AROM with L rotation for added stretch                          Suboccipital release 5 min for pain modulation due to cervicogenic head aches                          Supine cervical joint down glides bilaterally. Grade 3 for improve cervical AROM. 2x8 sec bouts C2-C7   Supine cervical paraspinal STM for 5 min for pain modulation.                            Cervical Rotation R/L:              AROM 65/ 60 deg post manual therapy after last session.              Post session pt's AROM was 69/60 deg (R/L):          There.ex:    Seated wrist extensor stretch with elbow straight: 3x45 sec holds.    Seated repeated cervical retractions: 2x15    Seated RUE Supination/pronation stretch: 3x30 sec/direction   Quadruped weight shifts onto RUE: 3x30 sec. PT modA to maintain RUE stability with elbow extension.    R head ache at 5/10 NPS, 1/10 NPS in R neck, RUE  pain overall at 6/10 NPS at dorsal forearm.   PT Education - 02/17/21 1659    Education Details form/technique with exercises.    Person(s) Educated Patient    Methods Explanation;Demonstration;Tactile cues;Verbal cues    Comprehension Verbalized understanding;Returned demonstration            PT Short Term Goals - 12/25/20 1825      PT SHORT TERM GOAL #1   Title After 4 weeks pt to report 2-3 HEP actiivties to help with symptoms management with flareups.    Time 4    Period Weeks    Status New    Target Date 01/22/21      PT SHORT TERM GOAL #2   Title After 4 weeks pt to report successful plan for safe walking program performance.    Baseline fearful of vertigo episode whilst walking, has been avoiding extensive walking    Time 4    Period Weeks    Status New    Target Date 01/22/21             PT Long Term Goals - 01/07/21 1812      PT LONG TERM GOAL #1   Title After 8 weeks pt to show 8 point improvement on FOTO survery to reflect improved functional activity tolerance.    Baseline 4/27: 18/43    Time 8    Period Weeks    Status New    Target Date 02/19/21      PT LONG TERM GOAL #2   Title After 8 weeks demonstrate improved cervical ROM in rotation >65 degrees bilat without pain provocation.    Baseline ~45 degrees bilat with pain during Rt rotation    Time 8    Period Weeks    Status New      PT LONG TERM GOAL #3   Title After 12 weeks pt to report ability to perform 8 hour workday without symptoms exacerbation pain>2 numbers on NPRS.    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    Baseline Unable to use RUE for hair management, pain and guarded with dressing self    Time 12    Period Weeks    Status New      PT LONG TERM GOAL #5    Title Pt will display R grip strength equal to LUE to demonstrate clinically significant improvment from cervical radicular symptoms to perform basic ADL's such as opening doors, cans, turning steering wheel.    Baseline L: 79-80 Kg, R: 10 Kg    Time 12    Period Weeks    Status New                 Plan - 02/17/21 1700    Clinical Impression Statement Pt appears to be further progressing in pain with R sided headaches with 8/10 NPS. Maintaining cervical AROM 60 deg to L and 65 deg to the R. Still having most difficulty with RUE pain that is persistent. Post session pt improved pain to R head ache at 5/10 NPS, 1/10 NPS in R neck, RUE pain overall at 6/10 NPS at dorsal forearm with manual therapy and therapeutic exercise. Pt will continue to benefit from skilled PT services to reduce pain and progress strength and mobility so pt can return to work and PLOF.    Personal Factors and Comorbidities Age;Education;Time since onset of injury/illness/exacerbation;Past/Current Experience    Examination-Activity Limitations Bathing;Bed Mobility;Carry;Dressing;Hygiene/Grooming;Lift;Reach Overhead;Self Feeding    Examination-Participation Restrictions Meal Prep;Cleaning;Occupation;Driving;Community Activity;Interpersonal Relationship;Laundry;Yard Work;Shop    Stability/Clinical Decision Making Unstable/Unpredictable    Rehab Potential Good    PT Frequency 2x / week    PT Duration 12 weeks    PT Treatment/Interventions ADLs/Self Care Home Management;Cryotherapy;Electrical Stimulation;Moist Heat;DME Instruction;Therapeutic exercise;Balance training;Neuromuscular re-education;Therapeutic activities;Functional mobility training;Patient/family education;Manual techniques;Passive range of motion;Dry needling;Vestibular    PT Next Visit Plan Progress postural muscle strength, cervical mobility, and RUE strength    PT Home Exercise Plan R UT stretch, supine cervical retractions, STM with tennis ball on wall on  rhomboids and paraspinals. Levator scap stretch, scap retractions. Radial/median nerve glides, suboccipital stretch.    Consulted and Agree with Plan of Care Patient           Patient will benefit from skilled therapeutic intervention in order to improve the following deficits and impairments:  Abnormal gait,Decreased activity tolerance,Decreased balance,Decreased range of motion,Decreased strength,Hypomobility,Dizziness,Increased muscle spasms,Impaired flexibility,Postural dysfunction  Visit Diagnosis: Cervicogenic headache  Cervicalgia     Problem List There are no problems to display for this patient.   Delphia Grates. Fairly IV, PT, DPT Physical Therapist- [place]  [facility]  02/17/2021, 5:22 PM  [facility] PHYSICAL AND SPORTS MEDICINE [address], [place], [address] Phone: [phone]   Fax:  [phone]  Name: Melanie Briggs MRN: [id number] Date of Birth: 05-Nov-1970

## 2021-02-18 ENCOUNTER — Telehealth: Payer: Self-pay

## 2021-02-18 NOTE — Telephone Encounter (Signed)
lvm to r/s 02/13/21 missed appointment-Toni

## 2021-02-19 ENCOUNTER — Other Ambulatory Visit: Payer: Self-pay

## 2021-02-19 ENCOUNTER — Ambulatory Visit: Payer: BC Managed Care – PPO

## 2021-02-19 DIAGNOSIS — G4486 Cervicogenic headache: Secondary | ICD-10-CM | POA: Diagnosis not present

## 2021-02-19 DIAGNOSIS — M542 Cervicalgia: Secondary | ICD-10-CM | POA: Diagnosis not present

## 2021-02-19 NOTE — Therapy (Signed)
[facility] PHYSICAL AND SPORTS MEDICINE [address] [place], [place], [address] Phone: [phone]   Fax:  [phone]  Physical Therapy Treatment  Patient Details  Name: Melanie Briggs MRN: [id number] Date of Birth: 10/10/1970 Referring Provider (PT): Nilda CalamitySarah Mason PA-C   Encounter Date: 02/19/2021   PT End of Session - 02/19/21 2025     Visit Number 15    Number of Visits 17    Date for PT Re-Evaluation 02/19/21    Authorization Type BCBS COMM PRO    Authorization Time Period 12/25/20-03/19/21    PT Start Time 1600    PT Stop Time 1647    PT Time Calculation (min) 47 min    Activity Tolerance Patient tolerated treatment well;No increased pain    Behavior During Therapy WFL for tasks assessed/performed             Past Medical History:  Diagnosis Date   Bronchitis    Depression    Diabetes mellitus without complication (HCC)    Hypertension    Migraine     Past Surgical History:  Procedure Laterality Date   ABDOMINAL HYSTERECTOMY     TUMOR EXCISION N/A     There were no vitals filed for this visit.   Subjective Assessment - 02/19/21 2023     Subjective Pt reports increase in RUE pain at 9/10 NPS and R head ache unable to rate besides "it hurts to blink" indicating pain > 10/10. Increased pain in R sided head ache due to reports aunt is not doing well in her health. Pt does report pain relief from previous session did last until following day.    Pertinent History Melanie Briggs is a 49yoF who comes to [facility] for help with chronic persistent Rt headache associated with Rt neck pain in the suboccipital/upper trap distribution with more recent development of referral to the right dorsal forearm. Pt reports head/neck symptoms since successful resolution with combined PT and injections in 2018 at [facility], but returned as a daily phenomenon in January of 2021 around the time of the passing of her mother. Pt reports the  involvement of her dorsal forearm pain is new to this episode. Pt has head/neck/arm symptoms daily, but has 1-2x weekly vertiginous dizziness episodes with associated spinning sensation and visual phenomenon that would often last for entire day, now managed to ~45 minutes with use of meclezine therapy. Pt reports head/neck/arm symptoms are worse in morning, then improved hours into the day and thereafter. Pt is essentailly guarded and slow with ADL actiivty due to symptoms, but has clear worsening of pain with Rt cervical rotation, cervical extension, and Right cervical sidebending. Recent MRI unremarkable, MRI in 2018 for same problem also unremarkable.    Limitations Sitting;Reading    How long can you sit comfortably? ~4 hours for work    How long can you stand comfortably? WNL    How long can you walk comfortably? WNL    Diagnostic tests MRI    Patient Stated Goals return to full time at work    Currently in Pain? Yes    Pain Score 10-Worst pain ever    Pain Location Head    Pain Orientation Right    Pain Descriptors / Indicators Aching    Pain Type Chronic pain    Pain Onset More than a month ago            Manual therapy:   25 min spent with pt in  supine.                         Scalenes stretch on R side: 3x30 sec    STM to cervical paraspinals for pain modulation: 5 min    Suboccipital release for R head ache relief: 8 min   STM to infraspinatus, teres minor for possible radial nerve entrapment at shoulder in sitting. 5 min total. Noted fasciculations of R tricep with soft tissue release and subjective reports of concordant RUE pain in radial nerve distribution.                         Suboccipital release 5 min for pain modulation due to cervicogenic head aches                         Supine cervical joint down glides bilaterally. Grade 3 for improve cervical AROM. 2x8 sec bouts C2-C7    There.ex:   Seated shoulder girdle elevation and depression: x15/direction   Wrist  extensor stretch with RUE elevated to close to 90 deg: 3x30 sec bouts   Seated shoulder IR blue TB isometric holds for reciprocal inhibition of infraspinatus/teres minor to reduce radial nerve tension: 2x10, 8 sec iso holds.    Pt educated how to perform at home to improve RUE pain.   Seated scap retractions: 1x10  Seated R bicep isometric holds in varying degrees of ROM for reciprocal inhibition of triceps to reduce radial nerve entrapment/tension. Pt reports no changes in symptoms so discontinued. 1x5, 8 sec iso holds.     Post treatment pt reports R sided head ache at 8/10 NPS and RUE pain at 4/10 NPS.     PT Education - 02/19/21 2025     Education Details form/technique with exercises.    Person(s) Educated Patient    Methods Explanation;Demonstration;Tactile cues;Verbal cues    Comprehension Verbalized understanding;Returned demonstration              PT Short Term Goals - 12/25/20 1825       PT SHORT TERM GOAL #1   Title After 4 weeks pt to report 2-3 HEP actiivties to help with symptoms management with flareups.    Time 4    Period Weeks    Status New    Target Date 01/22/21      PT SHORT TERM GOAL #2   Title After 4 weeks pt to report successful plan for safe walking program performance.    Baseline fearful of vertigo episode whilst walking, has been avoiding extensive walking    Time 4    Period Weeks    Status New    Target Date 01/22/21               PT Long Term Goals - 01/07/21 1812       PT LONG TERM GOAL #1   Title After 8 weeks pt to show 8 point improvement on FOTO survery to reflect improved functional activity tolerance.    Baseline 4/27: 18/43    Time 8    Period Weeks    Status New    Target Date 02/19/21      PT LONG TERM GOAL #2   Title After 8 weeks demonstrate improved cervical ROM in rotation >65 degrees bilat without pain provocation.    Baseline ~45 degrees bilat with pain during Rt rotation    Time 8  Period Weeks     Status New      PT LONG TERM GOAL #3   Title After 12 weeks pt to report ability to perform 8 hour workday without symptoms exacerbation pain>2 numbers on NPRS.    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    Baseline Unable to use RUE for hair management, pain and guarded with dressing self    Time 12    Period Weeks    Status New      PT LONG TERM GOAL #5   Title Pt will display R grip strength equal to LUE to demonstrate clinically significant improvment from cervical radicular symptoms to perform basic ADL's such as opening doors, cans, turning steering wheel.    Baseline L: 79-80 Kg, R: 10 Kg    Time 12    Period Weeks    Status New                   Plan - 02/19/21 2026     Clinical Impression Statement Due to persistent RUE pain with little relief from PT, PT further assessed location of radial nerve tension at R shoulder girdle. With palpation and pressure on infraspinatus, strong visible fasciculations occured at tricep muscle with concordant, radicualr pain that was reproduced alonf RUE in radial distribution. Further utilization of STM and manual techniques to improve head ache pain/RUE pain, with reciprocal inhibition and flexibility exercises to improve RUE pain. Overall pt rpeorts head ache down to a 8/10 NPS and RUE pain down to a 4/10 NPS. Pt educated on isometric shoulder IR used as reciprocal inhibition to decrease muscle tension in infraspinatus which may be causing radial nerve irriation. Overall pt appears to be making vaster improvements in her head ache pain levels between sessions but still is very limited in her R hand strength in particular gripping with wrist motions. Pt will continue to benefit from skilled PT services to address pain,  mobility, and strength so pt can return to PLOF.    Personal Factors and Comorbidities Age;Education;Time since onset of injury/illness/exacerbation;Past/Current Experience    Examination-Activity Limitations Bathing;Bed Mobility;Carry;Dressing;Hygiene/Grooming;Lift;Reach Overhead;Self Feeding    Examination-Participation Restrictions Meal Prep;Cleaning;Occupation;Driving;Community Activity;Interpersonal Relationship;Laundry;Yard Work;Shop    Stability/Clinical Decision Making Unstable/Unpredictable    Rehab Potential Good    PT Frequency 2x / week    PT Duration 12 weeks    PT Treatment/Interventions ADLs/Self Care Home Management;Cryotherapy;Electrical Stimulation;Moist Heat;DME Instruction;Therapeutic exercise;Balance training;Neuromuscular re-education;Therapeutic activities;Functional mobility training;Patient/family education;Manual techniques;Passive range of motion;Dry needling;Vestibular    PT Next Visit Plan Progress postural muscle strength, cervical mobility, and RUE strength    PT Home Exercise Plan R UT stretch, supine cervical retractions, STM with tennis ball on wall on rhomboids and paraspinals. Levator scap stretch, scap retractions. Radial/median nerve glides, suboccipital stretch.    Consulted and Agree with Plan of Care Patient             Patient will benefit from skilled therapeutic intervention in order to improve the following deficits and impairments:  Abnormal gait, Decreased activity tolerance, Decreased balance, Decreased range of motion, Decreased strength, Hypomobility, Dizziness, Increased muscle spasms, Impaired flexibility, Postural dysfunction  Visit Diagnosis: Cervicogenic headache  Cervicalgia  Problem List There are no problems to display for this patient.   Delphia Grates. Fairly IV, PT, DPT Physical Therapist- [place]  [facility]  02/19/2021, 8:32 PM  [facility] PHYSICAL AND SPORTS  MEDICINE [address], [place], [address] Phone: [phone]   Fax:  [phone]  Name: Melanie Briggs MRN: [id number] Date of Birth: May 27, 1971

## 2021-02-24 ENCOUNTER — Other Ambulatory Visit: Payer: Self-pay

## 2021-02-24 ENCOUNTER — Encounter: Payer: Self-pay | Admitting: Physical Therapy

## 2021-02-24 ENCOUNTER — Ambulatory Visit: Payer: BC Managed Care – PPO | Admitting: Physical Therapy

## 2021-02-24 DIAGNOSIS — M542 Cervicalgia: Secondary | ICD-10-CM

## 2021-02-24 DIAGNOSIS — G4486 Cervicogenic headache: Secondary | ICD-10-CM

## 2021-02-24 NOTE — Therapy (Signed)
[facility] PH[facility]ORTS MEDICINE [address] [place], [address] Phone: [phone]   Fax:  [phone]  Physical Therapy Treatment  Patient Details  Name: Melanie Briggs MRN: [id number] Date of Birth: November 30, 1970 Referring Provider (PT): Nilda Calamity PA-C   Encounter Date: 02/24/2021   PT End of Session - 02/24/21 1723     Visit Number 16    Number of Visits 17    Date for PT Re-Evaluation 02/19/21    Authorization Type BCBS COMM PRO    Authorization Time Period 12/25/20-03/19/21    PT Start Time 1634    PT Stop Time 1715    PT Time Calculation (min) 41 min    Activity Tolerance Patient tolerated treatment well;No increased pain    Behavior During Therapy WFL for tasks assessed/performed             Past Medical History:  Diagnosis Date   Bronchitis    Depression    Diabetes mellitus without complication (HCC)    Hypertension    Migraine     Past Surgical History:  Procedure Laterality Date   ABDOMINAL HYSTERECTOMY     TUMOR EXCISION N/A     There were no vitals filed for this visit.   Subjective Assessment - 02/24/21 1719     Subjective Pt reports continuous stress from aunt being ill. Has had to take muscle relaxer due to 13/10 pain NPS in R sided head ache but reports current head ache at 6/10 NPS.    Pertinent History Melanie Briggs is a 49yoF who comes to [facility] for help with chronic persistent Rt headache associated with Rt neck pain in the suboccipital/upper trap distribution with more recent development of referral to the right dorsal forearm. Pt reports head/neck symptoms since successful resolution with combined PT and injections in 2018 at [facility], but returned as a daily phenomenon in January of 2021 around the time of the passing of her mother. Pt reports the involvement of her dorsal forearm pain is new to this episode. Pt has head/neck/arm symptoms daily, but has 1-2x weekly vertiginous  dizziness episodes with associated spinning sensation and visual phenomenon that would often last for entire day, now managed to ~45 minutes with use of meclezine therapy. Pt reports head/neck/arm symptoms are worse in morning, then improved hours into the day and thereafter. Pt is essentailly guarded and slow with ADL actiivty due to symptoms, but has clear worsening of pain with Rt cervical rotation, cervical extension, and Right cervical sidebending. Recent MRI unremarkable, MRI in 2018 for same problem also unremarkable.    Limitations Sitting;Reading    How long can you sit comfortably? ~4 hours for work    How long can you stand comfortably? WNL    How long can you walk comfortably? WNL    Diagnostic tests MRI    Patient Stated Goals return to full time at work    Currently in Pain? Yes    Pain Score 6     Pain Location Head    Pain Orientation Right    Pain Descriptors / Indicators Aching    Pain Type Chronic pain    Pain Onset More than a month ago            Manual Therapy:                          STM to infraspinatus, teres minor for possible radial  nerve entrapment at shoulder in sitting. 10 min total.   STM to R dorsal forearm to wrist extensors for 2 min. MWM with active pronation and supination of R wrist for soft tissue release.    There.ex:   Seated exercises:    RUE wrist extensor stretch in elevated ranges of shoulder elevation for added stretch: 3x30sec. VC's to perform in pain free range.    RUE shoulder IR isometric holds with GTB for reciprocal inhibition for possible radial nerve entrapment. 1x10, 8 sec holds. TB around distal forearm due to poor R hand grip strength.    RUE shoulder ER GTB. TB around distal forearm due to poor R hand grip strength: 1x10     Seated scap retractions: 1x20     RUE supported on bolster:     Wrist extension holding lacrosse ball: 1x10. Significant difficulty maintaining grip on ball progressing wrist extension leading to  dropping of ball.     Wrist flexion holding lacrosse ball: 1x10.     AROM supination and pronation: 1x10/direction.   PT Education - 02/24/21 1722     Education Details form/technique with exercises.    Person(s) Educated Patient    Methods Explanation;Demonstration;Tactile cues;Verbal cues    Comprehension Verbalized understanding;Returned demonstration              PT Short Term Goals - 12/25/20 1825       PT SHORT TERM GOAL #1   Title After 4 weeks pt to report 2-3 HEP actiivties to help with symptoms management with flareups.    Time 4    Period Weeks    Status New    Target Date 01/22/21      PT SHORT TERM GOAL #2   Title After 4 weeks pt to report successful plan for safe walking program performance.    Baseline fearful of vertigo episode whilst walking, has been avoiding extensive walking    Time 4    Period Weeks    Status New    Target Date 01/22/21               PT Long Term Goals - 01/07/21 1812       PT LONG TERM GOAL #1   Title After 8 weeks pt to show 8 point improvement on FOTO survery to reflect improved functional activity tolerance.    Baseline 4/27: 18/43    Time 8    Period Weeks    Status New    Target Date 02/19/21      PT LONG TERM GOAL #2   Title After 8 weeks demonstrate improved cervical ROM in rotation >65 degrees bilat without pain provocation.    Baseline ~45 degrees bilat with pain during Rt rotation    Time 8    Period Weeks    Status New      PT LONG TERM GOAL #3   Title After 12 weeks pt to report ability to perform 8 hour workday without symptoms exacerbation pain>2 numbers on NPRS.    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    Baseline Unable to use RUE for hair management, pain and guarded with  dressing self    Time 12  Period Weeks    Status New      PT LONG TERM GOAL #5   Title Pt will display R grip strength equal to LUE to demonstrate clinically significant improvment from cervical radicular symptoms to perform basic ADL's such as opening doors, cans, turning steering wheel.    Baseline L: 79-80 Kg, R: 10 Kg    Time 12    Period Weeks    Status New                   Plan - 02/24/21 1724     Clinical Impression Statement Focus of session on improving RUE pain. Utilization of STM to R shoulder ER's and dorsal forearm for pain modulation and improvement in R radicular pain due to possible entrapment. Pt does report decrease in symptoms in RUE post STM. Attempts in gripped wrist strengthening holding lacrosse ball in extension, flexion, and AROM in supination and pronation. Pt continues to display significant difficulty gripping lacrosse ball with inability to maintain grip as she performs wrist extension releasing ball. PT will continue to focus on attemtping to clear up R radial nerve entrapment to improve RUE pain and progress RUE strengthening.    Personal Factors and Comorbidities Age;Education;Time since onset of injury/illness/exacerbation;Past/Current Experience    Examination-Activity Limitations Bathing;Bed Mobility;Carry;Dressing;Hygiene/Grooming;Lift;Reach Overhead;Self Feeding    Examination-Participation Restrictions Meal Prep;Cleaning;Occupation;Driving;Community Activity;Interpersonal Relationship;Laundry;Yard Work;Shop    Stability/Clinical Decision Making Unstable/Unpredictable    Rehab Potential Good    PT Frequency 2x / week    PT Duration 12 weeks    PT Treatment/Interventions ADLs/Self Care Home Management;Cryotherapy;Electrical Stimulation;Moist Heat;DME Instruction;Therapeutic exercise;Balance training;Neuromuscular re-education;Therapeutic activities;Functional mobility training;Patient/family education;Manual techniques;Passive range of  motion;Dry needling;Vestibular    PT Next Visit Plan Progress postural muscle strength, cervical mobility, and RUE strength    PT Home Exercise Plan R UT stretch, supine cervical retractions, STM with tennis ball on wall on rhomboids and paraspinals. Levator scap stretch, scap retractions. Radial/median nerve glides, suboccipital stretch.    Consulted and Agree with Plan of Care Patient             Patient will benefit from skilled therapeutic intervention in order to improve the following deficits and impairments:  Abnormal gait, Decreased activity tolerance, Decreased balance, Decreased range of motion, Decreased strength, Hypomobility, Dizziness, Increased muscle spasms, Impaired flexibility, Postural dysfunction  Visit Diagnosis: Cervicogenic headache  Cervicalgia     Problem List There are no problems to display for this patient.   Delphia Grates. Fairly IV, PT, DPT Physical Therapist- [place]  [facility]  02/24/2021, 5:44 PM  [facility] PHYSICAL AND SPORTS MEDICINE [address] [place], [address] Phone: [phone]   Fax:  [phone]  Name: Melanie Briggs MRN: [id number] Date of Birth: [birth year]

## 2021-02-26 ENCOUNTER — Ambulatory Visit: Payer: BC Managed Care – PPO | Admitting: Physical Therapy

## 2021-02-26 DIAGNOSIS — F4323 Adjustment disorder with mixed anxiety and depressed mood: Secondary | ICD-10-CM | POA: Diagnosis not present

## 2021-02-26 DIAGNOSIS — F5105 Insomnia due to other mental disorder: Secondary | ICD-10-CM | POA: Diagnosis not present

## 2021-03-03 ENCOUNTER — Ambulatory Visit: Payer: BC Managed Care – PPO | Admitting: Physical Therapy

## 2021-03-05 ENCOUNTER — Encounter: Payer: Self-pay | Admitting: Physical Therapy

## 2021-03-05 ENCOUNTER — Ambulatory Visit: Payer: BC Managed Care – PPO | Admitting: Physical Therapy

## 2021-03-05 DIAGNOSIS — G4486 Cervicogenic headache: Secondary | ICD-10-CM | POA: Diagnosis not present

## 2021-03-05 DIAGNOSIS — M542 Cervicalgia: Secondary | ICD-10-CM | POA: Diagnosis not present

## 2021-03-05 NOTE — Therapy (Signed)
[place] [facility] [address] [place], [address] Phone: [phone]   Fax:  [phone]  Physical Therapy Treatment  Patient Details  Name: Melanie Briggs MRN: [id number] Date of Birth: 1970-09-30 Referring Provider (PT): Nilda Calamity PA-C   Encounter Date: 03/05/2021   PT End of Session - 03/05/21 1511     Visit Number 17    Number of Visits 21    Date for PT Re-Evaluation 03/19/21    Authorization Type BCBS COMM PRO    Authorization Time Period 12/25/20-03/19/21    PT Start Time 1600    PT Stop Time 1645    PT Time Calculation (min) 45 min    Activity Tolerance Patient tolerated treatment well;No increased pain    Behavior During Therapy WFL for tasks assessed/performed             Past Medical History:  Diagnosis Date   Bronchitis    Depression    Diabetes mellitus without complication (HCC)    Hypertension    Migraine     Past Surgical History:  Procedure Laterality Date   ABDOMINAL HYSTERECTOMY     TUMOR EXCISION N/A     There were no vitals filed for this visit.   Subjective Assessment - 03/05/21 1502     Subjective Pt reports having another death in her family that she describes as overwhelming. She describes her arm pain as a 9/10 and her head pain as a 12/10. She reports experiencing most of her arm pain during late at night or earlier in the morning. Pt reports that she will be following up with her neurologist soon to discuss her medical plan of care. She reports having a normal EMG study for her right arm.    Pertinent History Melanie Briggs is a 49yoF who comes to [facility] for help with chronic persistent Rt headache associated with Rt neck pain in the suboccipital/upper trap distribution with more recent development of referral to the right dorsal forearm. Pt reports head/neck symptoms since successful resolution with combined PT and injections in 2018 at [facility], but returned  as a daily phenomenon in January of 2021 around the time of the passing of her mother. Pt reports the involvement of her dorsal forearm pain is new to this episode. Pt has head/neck/arm symptoms daily, but has 1-2x weekly vertiginous dizziness episodes with associated spinning sensation and visual phenomenon that would often last for entire day, now managed to ~45 minutes with use of meclezine therapy. Pt reports head/neck/arm symptoms are worse in morning, then improved hours into the day and thereafter. Pt is essentailly guarded and slow with ADL actiivty due to symptoms, but has clear worsening of pain with Rt cervical rotation, cervical extension, and Right cervical sidebending. Recent MRI unremarkable, MRI in 2018 for same problem also unremarkable.    Limitations Sitting;Reading    How long can you sit comfortably? ~4 hours for work    How long can you stand comfortably? WNL    How long can you walk comfortably? WNL    Diagnostic tests MRI    Patient Stated Goals return to full time at work    Currently in Pain? Yes    Pain Score 10-Worst pain ever    Pain Location Head    Pain Onset More than a month ago    Multiple Pain Sites Yes    Pain Score 9    Pain Location Arm    Pain  Orientation Right    Pain Descriptors / Indicators Aching    Pain Type Chronic pain    Pain Onset More than a month ago                Manual Therapy: Pt in supine for 30 min.  Sub-occipital Release 3x30 sec   R sided down Glides C2-C7 Cervical Vertebrae 3x10 sec bouts/level, grade 3. Noted restriction and hypomobility on R side with normal joint mobility on L side. Does report improvements in RUE pain from neck to elbow. -Pt notes improvement in cervicogenic headache 9/10 on pain scale   1st Rib Mobilization Grade 4,  2 x 10 sec bouts  Cervical Flexor Stretch 2 x 30 sec on R side  Wrist extensor Stretch with Extensor Carpi Radialis Brevis and longus friction Massage for possible radial nerve  entrapment at elbow.   THEREX  Seated IR/ER Isometric holds with GTB for 1x10/direction. VC's for form/technique. Seated Shoulder Abduction x 10 Seated Shoulder Flexion x 10  Seated Scap protraction and retraction: x10/direction R wrist extensor stretch in various ranges of shoulder elevation: 2x30 sec holds.     PT Education - 03/05/21 1506     Education Details form/technique with exercises    Person(s) Educated Patient    Methods Explanation    Comprehension Verbalized understanding              PT Short Term Goals - 03/05/21 1515       PT SHORT TERM GOAL #1   Title After 4 weeks pt to report 2-3 HEP actiivties to help with symptoms management with flareups.    Time 4    Period Weeks    Status New    Target Date 01/22/21      PT SHORT TERM GOAL #2   Title After 4 weeks pt to report successful plan for safe walking program performance.    Baseline fearful of vertigo episode whilst walking, has been avoiding extensive walking    Time 4    Period Weeks    Status New    Target Date 01/22/21               PT Long Term Goals - 03/05/21 1515       PT LONG TERM GOAL #1   Title After 8 weeks pt to show 8 point improvement on FOTO survery to reflect improved functional activity tolerance.    Baseline 4/27: 18/43    Time 8    Period Weeks    Status New      PT LONG TERM GOAL #2   Title After 8 weeks demonstrate improved cervical ROM in rotation >65 degrees bilat without pain provocation.    Baseline ~45 degrees bilat with pain during Rt rotation    Time 8    Period Weeks    Status New      PT LONG TERM GOAL #3   Title After 12 weeks pt to report ability to perform 8 hour workday without symptoms exacerbation pain>2 numbers on NPRS.    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    Baseline Unable to use RUE for hair management, pain and guarded with  dressing self    Time 12    Period Weeks    Status New      PT LONG TERM GOAL #5   Title Pt will display R grip strength equal to LUE to demonstrate clinically significant improvment from cervical radicular symptoms to perform basic ADL's such as opening doors, cans, turning steering wheel.    Baseline L: 79-80 Kg, R: 10 Kg    Time 12    Period Weeks    Status New                   Plan - 03/05/21 1514     Clinical Impression Statement Focus of session on improving R sided heache and RUE pain that is still persistent due to continuous family misfortunes affecting pt's pain and potential progress in PT per subjective reports. Pt does display good carryover in lowered pain levels in R head aches and RUE pain when not having stressful family situations occur but as events unfold she regresses to her baseline pain. Further utilization of STM to R shoulder ER's, suboccipitals, cervical paraspinals, and dorsal forearm for pain modulation and improvement in R radicular pain due to possible entrapment. First rib mobs and cervical mobs improve pt's pain from R sided neck to elbow but distal to elbow, pain persists. Pt does report decrease in symptoms in RUE post STM and cervical downglides to 9/10 NPS in R sided head ache and 6/10 NPS in RUE pain. PT will continue to focus on attemtping to clear up R radial nerve entrapment to improve RUE pain and progress RUE strengthening.    Personal Factors and Comorbidities Age;Education;Time since onset of injury/illness/exacerbation;Past/Current Experience    Examination-Activity Limitations Bathing;Bed Mobility;Carry;Dressing;Hygiene/Grooming;Lift;Reach Overhead;Self Feeding    Examination-Participation Restrictions Meal Prep;Cleaning;Occupation;Driving;Community Activity;Interpersonal Relationship;Laundry;Yard Work;Shop     Stability/Clinical Decision Making Unstable/Unpredictable    Rehab Potential Good    PT Frequency 2x / week    PT Duration 12 weeks    PT Treatment/Interventions ADLs/Self Care Home Management;Cryotherapy;Electrical Stimulation;Moist Heat;DME Instruction;Therapeutic exercise;Balance training;Neuromuscular re-education;Therapeutic activities;Functional mobility training;Patient/family education;Manual techniques;Passive range of motion;Dry needling;Vestibular    PT Next Visit Plan Progress postural muscle strength, cervical mobility, and RUE strength. Reassess first rib mobs    Consulted and Agree with Plan of Care Patient             Patient will benefit from skilled therapeutic intervention in order to improve the following deficits and impairments:  Abnormal gait, Decreased activity tolerance, Decreased balance, Decreased range of motion, Decreased strength, Hypomobility, Dizziness, Increased muscle spasms, Impaired flexibility, Postural dysfunction  Visit Diagnosis: Cervicogenic headache  Cervicalgia     Problem List There are no problems to display for this patient.   Delphia Grates. Fairly IV, PT, DPT Physical Therapist- [facility]  03/05/2021, 3:59 PM  [place] [facility] PH[facility] SPORTS MEDICINE [address], [place], [address] Phone: [phone]   Fax:  [phone]  Name: Atia Haupt MRN: [id number] Date of Birth: 1971/09/05

## 2021-03-10 ENCOUNTER — Ambulatory Visit: Payer: BC Managed Care – PPO | Admitting: Physical Therapy

## 2021-03-12 ENCOUNTER — Other Ambulatory Visit: Payer: Self-pay

## 2021-03-12 ENCOUNTER — Ambulatory Visit: Payer: BC Managed Care – PPO | Admitting: Physical Therapy

## 2021-03-12 DIAGNOSIS — M542 Cervicalgia: Secondary | ICD-10-CM | POA: Diagnosis not present

## 2021-03-12 DIAGNOSIS — G4486 Cervicogenic headache: Secondary | ICD-10-CM

## 2021-03-13 NOTE — Therapy (Signed)
[place] [facility]INE [address] [place], [address] Phone: [phone]   Fax:  [phone]  Physical Therapy Treatment  Patient Details  Name: Melanie Briggs MRN: [id number] Date of Birth: 1971-09-02 Referring Provider (PT): Nilda Calamity PA-C   Encounter Date: 03/12/2021   PT End of Session - 03/13/21 0932     Visit Number 18    Number of Visits 21    Date for PT Re-Evaluation 03/19/21    Authorization Type BCBS COMM PRO    Authorization Time Period 12/25/20-03/19/21    PT Start Time 1500    PT Stop Time 1545    PT Time Calculation (min) 45 min    Activity Tolerance Patient tolerated treatment well;No increased pain    Behavior During Therapy WFL for tasks assessed/performed             Past Medical History:  Diagnosis Date   Bronchitis    Depression    Diabetes mellitus without complication (HCC)    Hypertension    Migraine     Past Surgical History:  Procedure Laterality Date   ABDOMINAL HYSTERECTOMY     TUMOR EXCISION N/A     There were no vitals filed for this visit.   Subjective Assessment - 03/12/21 1505     Subjective Pt reports having a family incident with her cousin, and she is very upset. She is having incredibly bad head pain and she has not been able to open a door with right arm since Tuesday afternoon.    Pertinent History Melanie Briggs is a 49yoF who comes to [facility] for help with chronic persistent Rt headache associated with Rt neck pain in the suboccipital/upper trap distribution with more recent development of referral to the right dorsal forearm. Pt reports head/neck symptoms since successful resolution with combined PT and injections in 2018 at [facility], but returned as a daily phenomenon in January of 2021 around the time of the passing of her mother. Pt reports the involvement of her dorsal forearm pain is new to this episode. Pt has head/neck/arm symptoms daily, but  has 1-2x weekly vertiginous dizziness episodes with associated spinning sensation and visual phenomenon that would often last for entire day, now managed to ~45 minutes with use of meclezine therapy. Pt reports head/neck/arm symptoms are worse in morning, then improved hours into the day and thereafter. Pt is essentailly guarded and slow with ADL actiivty due to symptoms, but has clear worsening of pain with Rt cervical rotation, cervical extension, and Right cervical sidebending. Recent MRI unremarkable, MRI in 2018 for same problem also unremarkable.    Limitations Sitting;Reading    How long can you sit comfortably? ~4 hours for work    How long can you stand comfortably? WNL    How long can you walk comfortably? WNL    Diagnostic tests MRI    Patient Stated Goals return to full time at work    Currently in Pain? Yes    Pain Score 10-Worst pain ever    Pain Location Head    Pain Descriptors / Indicators Aching    Pain Type Chronic pain    Pain Onset More than a month ago    Pain Score 9    Pain Location Arm    Pain Orientation Right    Pain Descriptors / Indicators Aching    Pain Type Chronic pain    Pain Onset More than a month ago  MANUAL   Sub-occipital Release 2 min  Upper Trap Stretch Right and Left 6 x 30 sec    Upper Trap Massage and Trigger Point Release 10 min -Improvement in cervical mobility and muscle extensibility.    Cervical distraction 4 x 30 sec  -Pt reported relief of radicular symptoms on right arm.  R sided down Glides C2-C7 Cervical Vertebrae 3x10 sec bouts/level, grade 3. Noted restriction and hypomobility on R side with normal joint mobility on L side. Does report improvements in RUE pain from neck to elbow. -Pt notes improvement in cervicogenic headache 9/10 on pain scale   1st Rib Mobilization Grade 4,  2 x 10 sec bouts        PT Education - 03/13/21 0932     Education Details Educated on use icey hot and theracane to relieve  trigger point    Person(s) Educated Patient    Methods Explanation    Comprehension Verbalized understanding              PT Short Term Goals - 03/13/21 0938       PT SHORT TERM GOAL #1   Title After 4 weeks pt to report 2-3 HEP actiivties to help with symptoms management with flareups.    Time 4    Period Weeks    Status New    Target Date 01/22/21      PT SHORT TERM GOAL #2   Title After 4 weeks pt to report successful plan for safe walking program performance.    Baseline fearful of vertigo episode whilst walking, has been avoiding extensive walking    Time 4    Period Weeks    Status New    Target Date 01/22/21               PT Long Term Goals - 03/13/21 1610       PT LONG TERM GOAL #1   Title After 8 weeks pt to show 8 point improvement on FOTO survery to reflect improved functional activity tolerance.    Baseline 4/27: 18/43    Time 8    Period Weeks    Status New      PT LONG TERM GOAL #2   Title After 8 weeks demonstrate improved cervical ROM in rotation >65 degrees bilat without pain provocation.    Baseline ~45 degrees bilat with pain during Rt rotation    Time 8    Period Weeks    Status New      PT LONG TERM GOAL #3   Title After 12 weeks pt to report ability to perform 8 hour workday without symptoms exacerbation pain>2 numbers on NPRS.    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    Baseline Unable to use RUE for hair management, pain and guarded with dressing self    Time 12    Period Weeks    Status New      PT LONG TERM GOAL #5   Title Pt will display R grip strength equal to LUE to demonstrate clinically significant improvment from cervical radicular symptoms to perform basic ADL's such as opening doors, cans, turning  steering wheel.    Baseline L:  79-80 Kg, R: 10 Kg    Time 12    Period Weeks    Status New                   Plan - 03/13/21 5248     Clinical Impression Statement Pt presents for follow-up for right sided radiculopathy and cervicogenic headaches. She presents to today's session with increased pain with headaches and right arm pain likely related to stress related to her family. Pt demonstrates improvevement in cervical mobility and cervical musculature extensibility after receiving manual therapy. Due to pt arriving 10 min late, PT could not address right arm pain during visit. She will continue to benefit from skilled PT to decrease cervical and RUE pain and to improve strength in her RUE in order to open doors and to perform typing duties at her job.    Personal Factors and Comorbidities Age;Education;Time since onset of injury/illness/exacerbation;Past/Current Experience    Examination-Activity Limitations Bathing;Bed Mobility;Carry;Dressing;Hygiene/Grooming;Lift;Reach Overhead;Self Feeding    Examination-Participation Restrictions Meal Prep;Cleaning;Occupation;Driving;Community Activity;Interpersonal Relationship;Laundry;Yard Work;Shop    Stability/Clinical Decision Making Unstable/Unpredictable    Rehab Potential Good    PT Frequency 2x / week    PT Duration 12 weeks    PT Treatment/Interventions ADLs/Self Care Home Management;Cryotherapy;Electrical Stimulation;Moist Heat;DME Instruction;Therapeutic exercise;Balance training;Neuromuscular re-education;Therapeutic activities;Functional mobility training;Patient/family education;Manual techniques;Passive range of motion;Dry needling;Vestibular    PT Next Visit Plan Trial dry needling, progress cervical and periscapular strengthening    PT Home Exercise Plan R UT stretch, supine cervical retractions, STM with tennis ball on wall on rhomboids and paraspinals. Levator scap stretch, scap retractions. Radial/median nerve glides,  suboccipital stretch.    Consulted and Agree with Plan of Care Patient             Patient will benefit from skilled therapeutic intervention in order to improve the following deficits and impairments:  Abnormal gait, Decreased activity tolerance, Decreased balance, Decreased range of motion, Decreased strength, Hypomobility, Dizziness, Increased muscle spasms, Impaired flexibility, Postural dysfunction  Visit Diagnosis: Cervicogenic headache  Cervicalgia     Problem List There are no problems to display for this patient.  Ellin Goodie PT, DPT   03/13/2021, 9:39 AM  [place] [facility] PHYSICAL AND SPORTS MEDICINE [address], [place], [address] Phone: [phone]   Fax:  [phone]  Name: Melanie Briggs MRN: [id number] Date of Birth: 1971/04/16

## 2021-03-17 ENCOUNTER — Encounter: Payer: Self-pay | Admitting: Physical Therapy

## 2021-03-17 ENCOUNTER — Ambulatory Visit: Payer: BC Managed Care – PPO | Attending: Physician Assistant | Admitting: Physical Therapy

## 2021-03-17 DIAGNOSIS — R42 Dizziness and giddiness: Secondary | ICD-10-CM | POA: Insufficient documentation

## 2021-03-17 DIAGNOSIS — G4486 Cervicogenic headache: Secondary | ICD-10-CM | POA: Diagnosis not present

## 2021-03-17 DIAGNOSIS — M542 Cervicalgia: Secondary | ICD-10-CM | POA: Diagnosis not present

## 2021-03-17 NOTE — Therapy (Signed)
[place] [facility] [address], [place], [address] Phone: [phone]   Fax:  [phone]  Physical Therapy Treatment  Patient Details  Name: Melanie Briggs MRN: [id number] Date of Birth: [birth year] Referring Provider (PT): Nilda Calamity PA-C   Encounter Date: 03/17/2021   PT End of Session - 03/17/21 1752     Visit Number 19    Number of Visits 21    Date for PT Re-Evaluation 03/19/21    Authorization Type BCBS COMM PRO    Authorization Time Period 12/25/20-03/19/21    PT Start Time 1630    PT Stop Time 1715    PT Time Calculation (min) 45 min    Activity Tolerance Patient tolerated treatment well;No increased pain    Behavior During Therapy WFL for tasks assessed/performed             Past Medical History:  Diagnosis Date   Bronchitis    Depression    Diabetes mellitus without complication (HCC)    Hypertension    Migraine     Past Surgical History:  Procedure Laterality Date   ABDOMINAL HYSTERECTOMY     TUMOR EXCISION N/A     Vitals 120/98 HR 78    Subjective Assessment - 03/17/21 1639     Subjective Pt reports feeling that her neck pain has gotten better, but headaches and arm pain are still bothering her. She had issues opening door over the weekend.    Pertinent History Melanie Briggs is a 49yoF who comes to [facility] for help with chronic persistent Rt headache associated with Rt neck pain in the suboccipital/upper trap distribution with more recent development of referral to the right dorsal forearm. Pt reports head/neck symptoms since successful resolution with combined PT and injections in 2018 at [facility], but returned as a daily phenomenon in January of 2021 around the time of the passing of her mother. Pt reports the involvement of her dorsal forearm pain is new to this episode. Pt has head/neck/arm symptoms daily, but has 1-2x weekly vertiginous dizziness episodes with associated  spinning sensation and visual phenomenon that would often last for entire day, now managed to ~45 minutes with use of meclezine therapy. Pt reports head/neck/arm symptoms are worse in morning, then improved hours into the day and thereafter. Pt is essentailly guarded and slow with ADL actiivty due to symptoms, but has clear worsening of pain with Rt cervical rotation, cervical extension, and Right cervical sidebending. Recent MRI unremarkable, MRI in 2018 for same problem also unremarkable.    Limitations Sitting;Reading    How long can you sit comfortably? ~4 hours for work    How long can you stand comfortably? WNL    How long can you walk comfortably? WNL    Diagnostic tests MRI    Patient Stated Goals return to full time at work    Currently in Pain? Yes    Pain Score 10-Worst pain ever    Pain Location Head    Pain Orientation Right    Pain Descriptors / Indicators Aching    Pain Type Chronic pain    Pain Onset More than a month ago    Pain Score 8    Pain Location Arm    Pain Orientation Right    Pain Descriptors / Indicators Aching    Pain Type Chronic pain    Pain Onset More than a month ago  MANUAL:  Right Upper Extremity Extensor Massage and trigger point release   Right Pronator Teres Massage and Release  -Pt demonstrates ability to grip objects tighter and even open door which she was unable to do at beginning of visit. -Pt reports numbness and tingling in middle two fingers on palmer side of hand   Educated pt on self massage using tennis ball for pronator teres release.     PT Short Term Goals - 03/17/21 1758       PT SHORT TERM GOAL #1   Title After 4 weeks pt to report 2-3 HEP actiivties to help with symptoms management with flareups.    Time 4    Period Weeks    Status New    Target Date 01/22/21      PT SHORT TERM GOAL #2   Title After 4 weeks pt to report successful plan for safe walking program performance.    Baseline fearful of vertigo  episode whilst walking, has been avoiding extensive walking    Time 4    Period Weeks    Status New    Target Date 01/22/21      PT SHORT TERM GOAL #3   Status New               PT Long Term Goals - 03/17/21 1759       PT LONG TERM GOAL #1   Title After 8 weeks pt to show 8 point improvement on FOTO survery to reflect improved functional activity tolerance.    Baseline 4/27: 18/43    Time 8    Period Weeks    Status New      PT LONG TERM GOAL #2   Title After 8 weeks demonstrate improved cervical ROM in rotation >65 degrees bilat without pain provocation.    Baseline ~45 degrees bilat with pain during Rt rotation    Time 8    Period Weeks    Status New      PT LONG TERM GOAL #3   Title After 12 weeks pt to report ability to perform 8 hour workday without symptoms exacerbation pain>2 numbers on NPRS.    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    Baseline Unable to use RUE for hair management, pain and guarded with dressing self    Time 12    Period Weeks    Status New      PT LONG TERM GOAL #5   Title Pt will display R grip strength equal to LUE to demonstrate clinically significant improvment from cervical radicular symptoms to perform basic ADL's such as opening doors, cans, turning steering wheel.    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 She demonstrates improvement in grip strength after trigger point and massage of her prontator teres. Next visit will be reassessment of patient's goals given that it will be  at the end of her  recertification period. She will continue to benefit from PT to decrease severity of her headache, neck, andright arm pain in order to return to completing job related tasks such as typing.    Personal Factors and Comorbidities Age;Education;Time since onset of injury/illness/exacerbation;Past/Current Experience    Examination-Activity Limitations Bathing;Bed Mobility;Carry;Dressing;Hygiene/Grooming;Lift;Reach Overhead;Self Feeding    Examination-Participation Restrictions Meal Prep;Cleaning;Occupation;Driving;Community Activity;Interpersonal Relationship;Laundry;Yard Work;Shop    Stability/Clinical Decision Making Unstable/Unpredictable    Rehab Potential Good    PT Frequency 2x / week    PT Duration 12 weeks    PT Treatment/Interventions ADLs/Self Care Home Management;Cryotherapy;Electrical Stimulation;Moist Heat;DME Instruction;Therapeutic exercise;Balance training;Neuromuscular re-education;Therapeutic activities;Functional mobility training;Patient/family education;Manual techniques;Passive range of motion;Dry needling;Vestibular    PT Next Visit Plan Reassess goal and periscapular strengthening    PT Home Exercise Plan R UT stretch, supine cervical retractions, STM with tennis ball on wall on rhomboids and paraspinals. Levator scap stretch, scap retractions. Radial/median nerve glides, suboccipital stretch.    Consulted and Agree with Plan of Care Patient             Patient will benefit from skilled therapeutic intervention in order to improve the following deficits and impairments:  Abnormal gait, Decreased activity tolerance, Decreased balance, Decreased range of motion, Decreased strength, Hypomobility, Dizziness, Increased muscle spasms, Impaired flexibility, Postural dysfunction  Visit Diagnosis: Cervicogenic headache  Cervicalgia     Problem List There are no problems to display for this patient.  Ellin Goodie PT, DPT  03/17/2021, 6:05 PM  [facility]  [facility] PHYSICAL AND SPORTS MEDICINE [address] [place], [address] Phone: [phone]   Fax:  [phone]  Name: Melanie Briggs MRN: [id number] Date of Birth: 07/17/1971

## 2021-03-18 ENCOUNTER — Telehealth: Payer: Self-pay

## 2021-03-18 NOTE — Telephone Encounter (Signed)
Left vm to reschedule 02/13/21 missed appointment-Toni

## 2021-03-19 ENCOUNTER — Ambulatory Visit: Payer: BC Managed Care – PPO | Admitting: Physical Therapy

## 2021-03-19 DIAGNOSIS — R42 Dizziness and giddiness: Secondary | ICD-10-CM | POA: Diagnosis not present

## 2021-03-19 DIAGNOSIS — M542 Cervicalgia: Secondary | ICD-10-CM

## 2021-03-19 DIAGNOSIS — G4486 Cervicogenic headache: Secondary | ICD-10-CM

## 2021-03-19 NOTE — Therapy (Addendum)
[place] [facility]TS [facility] [address], [place], [address] Phone: [phone]   Fax:  [phone]  Physical Therapy Progress Note  Patient Details  Name: Melanie Briggs MRN: [id number] Date of Birth: 20-Jul-1971 Referring Provider (PT): Nilda Calamity PA-C   Encounter Date: 03/19/2021   PT End of Session - 03/20/21 1541     Visit Number 20    Number of Visits 28    Date for PT Re-Evaluation 03/19/21    Authorization Type BCBS COMM PRO    Authorization Time Period 12/25/20-03/19/21    PT Start Time 1630    PT Stop Time 1715    PT Time Calculation (min) 45 min    Activity Tolerance Patient tolerated treatment well;No increased pain    Behavior During Therapy WFL for tasks assessed/performed             Past Medical History:  Diagnosis Date   Bronchitis    Depression    Diabetes mellitus without complication (HCC)    Hypertension    Migraine     Past Surgical History:  Procedure Laterality Date   ABDOMINAL HYSTERECTOMY     TUMOR EXCISION N/A     There were no vitals filed for this visit.   Subjective Assessment - 03/19/21 1639     Subjective Pt reports feeling sensation right forearm on dorsal side. She tried doing soft tissue release that she was taught last session, but it did not work.    Pertinent History Melanie Briggs is a 49yoF who comes to [facility] for help with chronic persistent Rt headache associated with Rt neck pain in the suboccipital/upper trap distribution with more recent development of referral to the right dorsal forearm. Pt reports head/neck symptoms since successful resolution with combined PT and injections in 2018 at [facility], but returned as a daily phenomenon in January of 2021 around the time of the passing of her mother. Pt reports the involvement of her dorsal forearm pain is new to this episode. Pt has head/neck/arm symptoms daily, but has 1-2x weekly vertiginous dizziness  episodes with associated spinning sensation and visual phenomenon that would often last for entire day, now managed to ~45 minutes with use of meclezine therapy. Pt reports head/neck/arm symptoms are worse in morning, then improved hours into the day and thereafter. Pt is essentailly guarded and slow with ADL actiivty due to symptoms, but has clear worsening of pain with Rt cervical rotation, cervical extension, and Right cervical sidebending. Recent MRI unremarkable, MRI in 2018 for same problem also unremarkable.    Limitations Sitting;Reading    How long can you sit comfortably? ~4 hours for work    How long can you stand comfortably? WNL    How long can you walk comfortably? WNL    Diagnostic tests MRI    Patient Stated Goals return to full time at work    Currently in Pain? Yes    Pain Score 9     Pain Location Head    Pain Orientation Right    Pain Descriptors / Indicators Aching    Pain Type Chronic pain    Pain Onset More than a month ago    Pain Score 10    Pain Location Arm    Pain Orientation Right    Pain Descriptors / Indicators Aching    Pain Type Chronic pain    Pain Onset More than a month ago  Manual:   Pronator Teres Massage on RUE  -Burning sensation resolved after massage   Brachioradialis Massage on RUE    Supination Stretch on RUE  -Increased pain with supination and burning sensation in back of arm   Physical Performance:  Grip Strength 2 kg on R hand  and 60 on L hand      PT Short Term Goals - 03/20/21 1532       PT SHORT TERM GOAL #1   Title After 4 weeks pt to report 2-3 HEP actiivties to help with symptoms management with flareups.    Time 4    Period Weeks    Status Achieved    Target Date 01/22/21      PT SHORT TERM GOAL #2   Title After 4 weeks pt to report successful plan for safe walking program performance.    Baseline fearful of vertigo episode whilst walking, has been avoiding extensive walking 7/8: Need to test     Time 4    Period Weeks    Status New    Target Date 01/22/21      PT SHORT TERM GOAL #3   Status New               PT Long Term Goals - 03/19/21 1704       PT LONG TERM GOAL #1   Title After 8 weeks pt to show 8 point improvement on FOTO survery to reflect improved functional activity tolerance.    Baseline 4/27: 18/43 7/7: 30 Actual, 43 predicted    Time 4    Period Weeks    Status Achieved      PT LONG TERM GOAL #2   Title After 8 weeks demonstrate improved cervical ROM in rotation >65 degrees bilat without pain provocation.    Baseline ~45 degrees bilat with pain during Rt rotation 7/7: 60 R rot with pain at 60, 80 with left rot pain fee    Time 4    Period Weeks    Status Achieved      PT LONG TERM GOAL #3   Title After 12 weeks pt to report ability to perform 8 hour workday without symptoms exacerbation pain>2 numbers on NPRS.    Baseline tolerating 4 hour work day at eval 7/7: Double clicking is still painful    Time 4    Period Weeks    Status On-going    Target Date 04/16/21      PT LONG TERM GOAL #4   Title After 12 weeks pt to report ability to perfrom full RUE use and available ROM for grooming, self-care, and hygiene without pain limtiation.    Baseline Unable to use RUE for hair management, pain and guarded with dressing self 7/7: Pt still limited by pain dressing and putting up hair    Time 4    Period Weeks    Status On-going    Target Date 04/16/21      PT LONG TERM GOAL #5   Title Pt will display R grip strength equal to LUE to demonstrate clinically significant improvment from cervical radicular symptoms to perform basic ADL's such as opening doors, cans, turning steering wheel.    Baseline L: 79-80 Kg, R: 10 Kg 7/7: 22 Kg L, 2 Kg R    Time 4    Period Weeks    Status On-going    Target Date 04/16/21              Plan -  03/20/21 1522     Clinical Impression Statement Pt presents for re-evaluation for possible radial and nerve  entrapment 2/2 to pronator teres syndrome. Pt continues to demonstrate poor cross over in between treatments with reduction of symptoms with this session being most noticable for increase pain and neuralgia on doral side of her right forearm and motor weakness causing her not be able to grip object with her right thumb. While she does show improvement with cervical ROM, pt exhibits ongoing symptoms such as NT and pain in right arm. Given imaging and recent negative EMG, there is a lack of objective findings that support symptom presentation and explain ongoing symptoms. While pt has received significant PT up until this point, she will benefit from an additional 4 weeks to further assess whether her daily activities are causing poor cross over between sessions, and whether these activities can be modified accordingly. She is on short disability at moment, and has not been working for past several weeks. Therefore, work related tasks do not appear to be triggering ongoing symptoms and undoing soft tissue work during sessions. Otherwise, if pt is still presenting with same level of pain and NT in RUE, then she will require further mRI evaluation of right forearm to determine extent and location of nerve entrapments and potential further medical intervention.    Personal Factors and Comorbidities Age;Education;Time since onset of injury/illness/exacerbation;Past/Current Experience    Examination-Activity Limitations Bathing;Bed Mobility;Carry;Dressing;Hygiene/Grooming;Lift;Reach Overhead;Self Feeding    Examination-Participation Restrictions Meal Prep;Cleaning;Occupation;Driving;Community Activity;Interpersonal Relationship;Laundry;Yard Work;Shop    Stability/Clinical Decision Making Unstable/Unpredictable    Rehab Potential Good    PT Frequency 2x / week    PT Duration 12 weeks    PT Treatment/Interventions ADLs/Self Care Home Management;Cryotherapy;Electrical Stimulation;Moist Heat;DME Instruction;Therapeutic  exercise;Balance training;Neuromuscular re-education;Therapeutic activities;Functional mobility training;Patient/family education;Manual techniques;Passive range of motion;Dry needling;Vestibular    PT Next Visit Plan OT screen for carpal tunnel related issues, Periscapular strengthening, supination stretch, and biceps and triceps exercises. Soft tissue work on pronator teres to relieve any trigger points    PT Home Exercise Plan R UT stretch, supine cervical retractions, STM with tennis ball on wall on rhomboids and paraspinals. Levator scap stretch, scap retractions. Radial/median nerve glides, suboccipital stretch.    Consulted and Agree with Plan of Care Patient               Patient will benefit from skilled therapeutic intervention in order to improve the following deficits and impairments:  Abnormal gait, Decreased activity tolerance, Decreased balance, Decreased range of motion, Decreased strength, Hypomobility, Dizziness, Increased muscle spasms, Impaired flexibility, Postural dysfunction  Visit Diagnosis: Cervicogenic headache - Plan: PT plan of care cert/re-cert  Cervicalgia - Plan: PT plan of care cert/re-cert     Problem List There are no problems to display for this patient.  Ellin Goodie PT, DPT  03/20/2021, 3:52 PM  [facility] PHYSICAL AND SPORTS MEDICINE [address] [place], [address] Phone: [phone]   Fax:  [phone]  Name: Clarissa Laird MRN: [id number] Date of Birth: 22-Apr-1971

## 2021-03-20 DIAGNOSIS — Z111 Encounter for screening for respiratory tuberculosis: Secondary | ICD-10-CM | POA: Diagnosis not present

## 2021-03-20 NOTE — Addendum Note (Signed)
Addended by: Johnn Hai on: 03/20/2021 03:53 PM   Modules accepted: Orders

## 2021-03-23 ENCOUNTER — Other Ambulatory Visit: Payer: Self-pay

## 2021-03-23 ENCOUNTER — Ambulatory Visit: Payer: BC Managed Care – PPO | Admitting: Physical Therapy

## 2021-03-23 DIAGNOSIS — M542 Cervicalgia: Secondary | ICD-10-CM

## 2021-03-23 DIAGNOSIS — R42 Dizziness and giddiness: Secondary | ICD-10-CM | POA: Diagnosis not present

## 2021-03-23 DIAGNOSIS — G4486 Cervicogenic headache: Secondary | ICD-10-CM | POA: Diagnosis not present

## 2021-03-24 DIAGNOSIS — Z111 Encounter for screening for respiratory tuberculosis: Secondary | ICD-10-CM | POA: Diagnosis not present

## 2021-03-26 DIAGNOSIS — Z111 Encounter for screening for respiratory tuberculosis: Secondary | ICD-10-CM | POA: Diagnosis not present

## 2021-03-28 NOTE — Therapy (Signed)
[place] [facility] [address] [place], [place], [address] Phone: [phone]   Fax:  [phone]  Physical Therapy Treatment  Patient Details  Name: Melanie Briggs MRN: [id number] Date of Birth: [birth year] Referring Provider (PT): Nilda Calamity PA-C   Encounter Date: 03/23/2021   PT End of Session - 03/28/21 1022     Visit Number 21    Number of Visits 21    Date for PT Re-Evaluation 03/23/21    Authorization Type BCBS COMM PRO    Authorization Time Period 12/25/20-03/19/21    PT Start Time 0736    PT Stop Time 0912    PT Time Calculation (min) 96 min    Activity Tolerance Patient tolerated treatment well;No increased pain    Behavior During Therapy WFL for tasks assessed/performed             Past Medical History:  Diagnosis Date   Bronchitis    Depression    Diabetes mellitus without complication (HCC)    Hypertension    Migraine     Past Surgical History:  Procedure Laterality Date   ABDOMINAL HYSTERECTOMY     TUMOR EXCISION N/A     There were no vitals filed for this visit.                                PT Short Term Goals - 03/20/21 1532       PT SHORT TERM GOAL #1   Title After 4 weeks pt to report 2-3 HEP actiivties to help with symptoms management with flareups.    Time 4    Period Weeks    Status Achieved    Target Date 01/22/21      PT SHORT TERM GOAL #2   Title After 4 weeks pt to report successful plan for safe walking program performance.    Baseline fearful of vertigo episode whilst walking, has been avoiding extensive walking 7/8: Need to test    Time 4    Period Weeks    Status New    Target Date 01/22/21      PT SHORT TERM GOAL #3   Status New               PT Long Term Goals - 03/19/21 1704       PT LONG TERM GOAL #1   Title After 8 weeks pt to show 8 point improvement on FOTO survery to reflect improved functional activity tolerance.    Baseline  4/27: 18/43 7/7: 30 Actual, 43 predicted    Time 4    Period Weeks    Status Achieved      PT LONG TERM GOAL #2   Title After 8 weeks demonstrate improved cervical ROM in rotation >65 degrees bilat without pain provocation.    Baseline ~45 degrees bilat with pain during Rt rotation 7/7: 60 R rot with pain at 60, 80 with left rot pain fee    Time 4    Period Weeks    Status Achieved      PT LONG TERM GOAL #3   Title After 12 weeks pt to report ability to perform 8 hour workday without symptoms exacerbation pain>2 numbers on NPRS.    Baseline tolerating 4 hour work day at eval 7/7: Double clicking is still painful    Time 4    Period Weeks    Status On-going  Target Date 04/16/21      PT LONG TERM GOAL #4   Title After 12 weeks pt to report ability to perfrom full RUE use and available ROM for grooming, self-care, and hygiene without pain limtiation.    Baseline Unable to use RUE for hair management, pain and guarded with dressing self 7/7: Pt still limited by pain dressing and putting up hair    Time 4    Period Weeks    Status On-going    Target Date 04/16/21      PT LONG TERM GOAL #5   Title Pt will display R grip strength equal to LUE to demonstrate clinically significant improvment from cervical radicular symptoms to perform basic ADL's such as opening doors, cans, turning steering wheel.    Baseline L: 79-80 Kg, R: 10 Kg 7/7: 22 Kg L, 2 Kg R    Time 4    Period Weeks    Status On-going    Target Date 04/16/21                    Patient will benefit from skilled therapeutic intervention in order to improve the following deficits and impairments:     Visit Diagnosis: Cervicogenic headache  Cervicalgia  Dizziness and giddiness     Problem List There are no problems to display for this patient.   Cammie Mcgee 03/28/2021, 10:23 AM  [place] [facility] [address] [place], [place], [address] Phone:  [phone]   Fax:  [phone]  Name: Melanie Briggs MRN: [id number] Date of Birth: 1970-12-19

## 2021-04-20 ENCOUNTER — Ambulatory Visit: Payer: BC Managed Care – PPO | Admitting: Physician Assistant

## 2021-04-21 ENCOUNTER — Ambulatory Visit: Payer: BC Managed Care – PPO | Admitting: Nurse Practitioner

## 2021-05-27 ENCOUNTER — Other Ambulatory Visit: Payer: Self-pay | Admitting: Physician Assistant

## 2021-05-27 DIAGNOSIS — G8929 Other chronic pain: Secondary | ICD-10-CM

## 2021-06-12 ENCOUNTER — Other Ambulatory Visit: Payer: Self-pay | Admitting: Nurse Practitioner

## 2021-06-12 ENCOUNTER — Telehealth: Payer: Self-pay

## 2021-06-12 DIAGNOSIS — R0609 Other forms of dyspnea: Secondary | ICD-10-CM

## 2021-06-12 DIAGNOSIS — E119 Type 2 diabetes mellitus without complications: Secondary | ICD-10-CM

## 2021-06-12 MED ORDER — ALBUTEROL SULFATE HFA 108 (90 BASE) MCG/ACT IN AERS: 2.0000 | INHALATION_SPRAY | Freq: Four times a day (QID) | RESPIRATORY_TRACT | 0 refills | Status: DC | PRN

## 2021-06-12 MED ORDER — ACCU-CHEK GUIDE VI STRP: ORAL_STRIP | 0 refills | Status: DC

## 2021-06-12 NOTE — Telephone Encounter (Signed)
Pt called requesting refills on test strips and inhaler.  I informed pt that she has missed and cancelled appts and that she will need to reschedule which she got appt for 06/16/21, I filled prescriptions for 30 days.  I informed patient that she needs to make sure she comes for her appointments or it could be a chance that we could dismiss her from the practice due to being non-compliant.  Pt understood and informed me that she will come for her appt.

## 2021-06-16 ENCOUNTER — Encounter: Payer: Self-pay | Admitting: Nurse Practitioner

## 2021-06-16 ENCOUNTER — Ambulatory Visit: Payer: BC Managed Care – PPO | Admitting: Nurse Practitioner

## 2021-06-16 ENCOUNTER — Other Ambulatory Visit: Payer: Self-pay

## 2021-06-16 VITALS — BP 104/81 | HR 75 | Temp 98.2°F | Resp 16 | Ht 65.0 in | Wt 186.0 lb

## 2021-06-16 DIAGNOSIS — R0609 Other forms of dyspnea: Secondary | ICD-10-CM

## 2021-06-16 DIAGNOSIS — U099 Post covid-19 condition, unspecified: Secondary | ICD-10-CM

## 2021-06-16 DIAGNOSIS — R519 Headache, unspecified: Secondary | ICD-10-CM

## 2021-06-16 DIAGNOSIS — R42 Dizziness and giddiness: Secondary | ICD-10-CM

## 2021-06-16 DIAGNOSIS — G8929 Other chronic pain: Secondary | ICD-10-CM

## 2021-06-16 DIAGNOSIS — I10 Essential (primary) hypertension: Secondary | ICD-10-CM

## 2021-06-16 DIAGNOSIS — R0602 Shortness of breath: Secondary | ICD-10-CM | POA: Diagnosis not present

## 2021-06-16 DIAGNOSIS — M542 Cervicalgia: Secondary | ICD-10-CM

## 2021-06-16 DIAGNOSIS — E119 Type 2 diabetes mellitus without complications: Secondary | ICD-10-CM | POA: Diagnosis not present

## 2021-06-16 LAB — POCT GLYCOSYLATED HEMOGLOBIN (HGB A1C): Hemoglobin A1C: 6.2 % — AB (ref 4.0–5.6)

## 2021-06-16 MED ORDER — METOPROLOL SUCCINATE ER 50 MG PO TB24: 50.0000 mg | ORAL_TABLET | Freq: Every day | ORAL | 1 refills | Status: DC

## 2021-06-16 MED ORDER — ALBUTEROL SULFATE HFA 108 (90 BASE) MCG/ACT IN AERS
2.0000 | INHALATION_SPRAY | Freq: Four times a day (QID) | RESPIRATORY_TRACT | 2 refills | Status: DC | PRN
Start: 2021-06-16 — End: 2021-11-18

## 2021-06-16 MED ORDER — CYCLOBENZAPRINE HCL 10 MG PO TABS: 10.0000 mg | ORAL_TABLET | Freq: Three times a day (TID) | ORAL | 0 refills | Status: DC | PRN

## 2021-06-16 MED ORDER — MECLIZINE HCL 25 MG PO TABS: 25.0000 mg | ORAL_TABLET | Freq: Two times a day (BID) | ORAL | 1 refills | Status: DC | PRN

## 2021-06-16 MED ORDER — DAPAGLIFLOZIN PROPANEDIOL 10 MG PO TABS: 10.0000 mg | ORAL_TABLET | Freq: Every day | ORAL | 1 refills | Status: DC

## 2021-06-16 MED ORDER — AMLODIPINE BESYLATE 5 MG PO TABS: 5.0000 mg | ORAL_TABLET | Freq: Every day | ORAL | 1 refills | Status: DC

## 2021-06-16 MED ORDER — HYDROCHLOROTHIAZIDE 25 MG PO TABS: 25.0000 mg | ORAL_TABLET | Freq: Every day | ORAL | 1 refills | Status: DC

## 2021-06-16 MED ORDER — ACCU-CHEK GUIDE VI STRP: ORAL_STRIP | 2 refills | Status: DC

## 2021-06-16 MED ORDER — BUTALBITAL-APAP-CAFFEINE 50-325-40 MG PO TABS
1.0000 | ORAL_TABLET | Freq: Four times a day (QID) | ORAL | 0 refills | Status: DC | PRN
Start: 2021-06-16 — End: 2021-07-21

## 2021-06-16 NOTE — Progress Notes (Signed)
[facility] [address] [place], [place] [address]  Internal MEDICINE  Office Visit Note  Patient Name: Melanie Briggs  [id number]  [id number]  Date of Service: 06/16/2021  Chief Complaint  Patient presents with   Follow-up    Has needed to use inhaler more    Depression   Diabetes   Hypertension   Headache    HPI Reis presents for a follow up visit for diabetes, hypertension and dyspnea on exertion. In January this year, Mayzie presents for her annual wellness visit and physical exam with dyspnea on exertion. She had an echocardiogram done which was grossly unremarkable. She has no diagnosis of asthma, COPD or CHF. She is a former smoker but on smoked cigarettes for 3 years. Her A1C was checked today and it was 6.2 which is improved from 6.6 in February 2022.    Current Medication: Outpatient Encounter Medications as of 06/16/2021  Medication Sig   Accu-Chek Softclix Lancets lancets Use to check blood sugars twice a day  E11.65   acetaminophen (TYLENOL) 500 MG tablet Take 1,000 mg by mouth daily as needed for headache.    butalbital-acetaminophen-caffeine (FIORICET) 50-325-40 MG tablet Take 1 tablet by mouth every 6 (six) hours as needed for headache.   diphenhydrAMINE (BENADRYL) 25 MG tablet Take 25 mg by mouth every 6 (six) hours as needed (allergic reaction).   lidocaine (XYLOCAINE) 2 % solution Use as directed 5 mLs in the mouth or throat every 6 (six) hours as needed for mouth pain.   [DISCONTINUED] albuterol (VENTOLIN HFA) 108 (90 Base) MCG/ACT inhaler Inhale 2 puffs into the lungs every 6 (six) hours as needed for wheezing or shortness of breath.   [DISCONTINUED] amLODipine (NORVASC) 5 MG tablet Take 1 tablet (5 mg total) by mouth daily.   [DISCONTINUED] cyclobenzaprine (FLEXERIL) 10 MG tablet Take 1 tablet (10 mg total) by mouth 3 (three) times daily as needed for muscle spasms.   [DISCONTINUED] dapagliflozin propanediol (FARXIGA) 10 MG TABS tablet Take 1  tablet (10 mg total) by mouth daily before breakfast.   [DISCONTINUED] glucose blood (ACCU-CHEK GUIDE) test strip Use to check blood sugars twice a day  E11.65   [DISCONTINUED] hydrochlorothiazide (HYDRODIURIL) 25 MG tablet Take 1 tablet (25 mg total) by mouth daily.   [DISCONTINUED] ibuprofen (ADVIL,MOTRIN) 600 MG tablet Take 1 tablet (600 mg total) by mouth every 8 (eight) hours as needed.   [DISCONTINUED] meclizine (ANTIVERT) 25 MG tablet Take 1 tablet (25 mg total) by mouth 2 (two) times daily as needed for dizziness.   [DISCONTINUED] metoprolol succinate (TOPROL-XL) 50 MG 24 hr tablet Take 50 mg by mouth daily. Take with or immediately following a meal.   [DISCONTINUED] naproxen sodium (ANAPROX) 220 MG tablet Take 220 mg by mouth daily as needed (pain).   [DISCONTINUED] traZODone (DESYREL) 50 MG tablet Take 1 tablet (50 mg total) by mouth at bedtime.   albuterol (VENTOLIN HFA) 108 (90 Base) MCG/ACT inhaler Inhale 2 puffs into the lungs every 6 (six) hours as needed for wheezing or shortness of breath.   amLODipine (NORVASC) 5 MG tablet Take 1 tablet (5 mg total) by mouth daily.   cyclobenzaprine (FLEXERIL) 10 MG tablet Take 1 tablet (10 mg total) by mouth 3 (three) times daily as needed for muscle spasms.   dapagliflozin propanediol (FARXIGA) 10 MG TABS tablet Take 1 tablet (10 mg total) by mouth daily before breakfast.   glucose blood (ACCU-CHEK GUIDE) test strip Use to check blood sugars twice a day  E11.65   hydrochlorothiazide (HYDRODIURIL) 25 MG tablet Take 1 tablet (25 mg total) by mouth daily.   meclizine (ANTIVERT) 25 MG tablet Take 1 tablet (25 mg total) by mouth 2 (two) times daily as needed for dizziness.   metoprolol succinate (TOPROL-XL) 50 MG 24 hr tablet Take 1 tablet (50 mg total) by mouth daily. Take with or immediately following a meal.   No facility-administered encounter medications on file as of 06/16/2021.    Surgical History: Past Surgical History:  Procedure  Laterality Date   ABDOMINAL HYSTERECTOMY     TUMOR EXCISION N/A     Medical History: Past Medical History:  Diagnosis Date   Bronchitis    Depression    Diabetes mellitus without complication (HCC)    Hypertension    Migraine     Family History: Family History  Problem Relation Age of Onset   Diabetes Other    Diabetes Mother    Kidney failure Mother    Pancreatitis Mother    Hyperlipidemia Father     Social History   Socioeconomic History   Marital status: Single    Spouse name: Not on file   Number of children: Not on file   Years of education: Not on file   Highest education level: Not on file  Occupational History   Not on file  Tobacco Use   Smoking status: Former    Years: 3.00    Types: Cigarettes   Smokeless tobacco: Never  Substance and Sexual Activity   Alcohol use: Yes    Comment: socailly   Drug use: No   Sexual activity: Not on file  Other Topics Concern   Not on file  Social History Narrative   Not on file   Social Determinants of Health   Financial Resource Strain: Not on file  Food Insecurity: Not on file  Transportation Needs: Not on file  Physical Activity: Not on file  Stress: Not on file  Social Connections: Not on file  Intimate Partner Violence: Not on file      Review of Systems  Constitutional:  Negative for chills, fatigue and unexpected weight change.  HENT:  Negative for congestion, rhinorrhea, sneezing and sore throat.   Eyes:  Negative for redness.  Respiratory:  Negative for cough, chest tightness and shortness of breath.   Cardiovascular:  Negative for chest pain and palpitations.  Gastrointestinal:  Negative for abdominal pain, constipation, diarrhea, nausea and vomiting.  Genitourinary:  Negative for dysuria and frequency.  Musculoskeletal:  Negative for arthralgias, back pain, joint swelling and neck pain.  Skin:  Negative for rash.  Neurological: Negative.  Negative for tremors and numbness.  Hematological:   Negative for adenopathy. Does not bruise/bleed easily.  Psychiatric/Behavioral:  Negative for behavioral problems (Depression), sleep disturbance and suicidal ideas. The patient is not nervous/anxious.    Vital Signs: BP 104/81   Pulse 75   Temp 98.2 F (36.8 C)   Resp 16   Ht 5\' 5"  (1.651 m)   Wt 186 lb (84.4 kg)   SpO2 99%   BMI 30.95 kg/m    Physical Exam Vitals reviewed.  Constitutional:      General: She is not in acute distress.    Appearance: Normal appearance. She is obese. She is not ill-appearing.  HENT:     Head: Normocephalic and atraumatic.  Eyes:     Extraocular Movements: Extraocular movements intact.     Pupils: Pupils are equal, round, and reactive to light.  Cardiovascular:  Rate and Rhythm: Normal rate and regular rhythm.  Pulmonary:     Effort: Pulmonary effort is normal. No respiratory distress.  Neurological:     Mental Status: She is alert and oriented to person, place, and time.     Cranial Nerves: No cranial nerve deficit.     Coordination: Coordination normal.     Gait: Gait normal.  Psychiatric:        Mood and Affect: Mood normal.        Behavior: Behavior normal.       Assessment/Plan: 1. Type 2 diabetes mellitus without complication, without long-term current use of insulin (HCC) A1C improving, continue current medications, repeat A1C in 3 months.  - POCT HgB A1C - dapagliflozin propanediol (FARXIGA) 10 MG TABS tablet; Take 1 tablet (10 mg total) by mouth daily before breakfast.  Dispense: 90 tablet; Refill: 1 - glucose blood (ACCU-CHEK GUIDE) test strip; Use to check blood sugars twice a day  E11.65  Dispense: 300 each; Refill: 2  2. Post-COVID chronic shortness of breath PFT ordered, albuterol rescue inhaler prescribed - albuterol (VENTOLIN HFA) 108 (90 Base) MCG/ACT inhaler; Inhale 2 puffs into the lungs every 6 (six) hours as needed for wheezing or shortness of breath.  Dispense: 8 g; Refill: 2 - Pulmonary function test;  Future  3. Dyspnea on exertion PFT ordered, echocardiogram was normal - albuterol (VENTOLIN HFA) 108 (90 Base) MCG/ACT inhaler; Inhale 2 puffs into the lungs every 6 (six) hours as needed for wheezing or shortness of breath.  Dispense: 8 g; Refill: 2 - Pulmonary function test; Future  4. Essential hypertension Stable, continue medications as prescribed.  - amLODipine (NORVASC) 5 MG tablet; Take 1 tablet (5 mg total) by mouth daily.  Dispense: 90 tablet; Refill: 1 - hydrochlorothiazide (HYDRODIURIL) 25 MG tablet; Take 1 tablet (25 mg total) by mouth daily.  Dispense: 90 tablet; Refill: 1  5. Chronic nonintractable headache, unspecified headache type Stable with current medications - metoprolol succinate (TOPROL-XL) 50 MG 24 hr tablet; Take 1 tablet (50 mg total) by mouth daily. Take with or immediately following a meal.  Dispense: 90 tablet; Refill: 1 - butalbital-acetaminophen-caffeine (FIORICET) 50-325-40 MG tablet; Take 1 tablet by mouth every 6 (six) hours as needed for headache.  Dispense: 14 tablet; Refill: 0  6. Chronic neck pain Refill ordered - cyclobenzaprine (FLEXERIL) 10 MG tablet; Take 1 tablet (10 mg total) by mouth 3 (three) times daily as needed for muscle spasms.  Dispense: 60 tablet; Refill: 0  7. Dizziness, nonspecific Refill ordered - meclizine (ANTIVERT) 25 MG tablet; Take 1 tablet (25 mg total) by mouth 2 (two) times daily as needed for dizziness.  Dispense: 30 tablet; Refill: 1   General Counseling: Maxie verbalizes understanding of the findings of todays visit and agrees with plan of treatment. I have discussed any further diagnostic evaluation that may be needed or ordered today. We also reviewed her medications today. she has been encouraged to call the office with any questions or concerns that should arise related to todays visit.    Orders Placed This Encounter  Procedures   POCT HgB A1C   Pulmonary function test    Meds ordered this encounter   Medications   metoprolol succinate (TOPROL-XL) 50 MG 24 hr tablet    Sig: Take 1 tablet (50 mg total) by mouth daily. Take with or immediately following a meal.    Dispense:  90 tablet    Refill:  1   amLODipine (NORVASC) 5 MG tablet  Sig: Take 1 tablet (5 mg total) by mouth daily.    Dispense:  90 tablet    Refill:  1   meclizine (ANTIVERT) 25 MG tablet    Sig: Take 1 tablet (25 mg total) by mouth 2 (two) times daily as needed for dizziness.    Dispense:  30 tablet    Refill:  1   dapagliflozin propanediol (FARXIGA) 10 MG TABS tablet    Sig: Take 1 tablet (10 mg total) by mouth daily before breakfast.    Dispense:  90 tablet    Refill:  1   hydrochlorothiazide (HYDRODIURIL) 25 MG tablet    Sig: Take 1 tablet (25 mg total) by mouth daily.    Dispense:  90 tablet    Refill:  1   cyclobenzaprine (FLEXERIL) 10 MG tablet    Sig: Take 1 tablet (10 mg total) by mouth 3 (three) times daily as needed for muscle spasms.    Dispense:  60 tablet    Refill:  0   glucose blood (ACCU-CHEK GUIDE) test strip    Sig: Use to check blood sugars twice a day  E11.65    Dispense:  300 each    Refill:  2   albuterol (VENTOLIN HFA) 108 (90 Base) MCG/ACT inhaler    Sig: Inhale 2 puffs into the lungs every 6 (six) hours as needed for wheezing or shortness of breath.    Dispense:  8 g    Refill:  2   butalbital-acetaminophen-caffeine (FIORICET) 50-325-40 MG tablet    Sig: Take 1 tablet by mouth every 6 (six) hours as needed for headache.    Dispense:  14 tablet    Refill:  0    Return in about 3 months (around 09/16/2021) for CPE, Recheck A1C, Breniyah Romm PCP, need additional follow up for PFT results. .   Total time spent:30 Minutes Time spent includes review of chart, medications, test results, and follow up plan with the patient.   [place] Cont[place] Substance Database was reviewed by me.  This patient was seen by Sallyanne Kuster, FNP-C in collaboration with Dr. Beverely Risen as a part of collaborative  care agreement.   Shelsey Rieth R. Tedd Sias, MSN, FNP-C Internal medicine

## 2021-07-01 ENCOUNTER — Ambulatory Visit: Payer: BC Managed Care – PPO | Admitting: Internal Medicine

## 2021-07-15 ENCOUNTER — Ambulatory Visit: Payer: 59 | Admitting: Nurse Practitioner

## 2021-07-15 ENCOUNTER — Ambulatory Visit: Payer: 59 | Admitting: Internal Medicine

## 2021-07-21 ENCOUNTER — Other Ambulatory Visit: Payer: Self-pay | Admitting: Nurse Practitioner

## 2021-07-21 DIAGNOSIS — G8929 Other chronic pain: Secondary | ICD-10-CM

## 2021-07-21 MED ORDER — BUTALBITAL-APAP-CAFFEINE 50-325-40 MG PO TABS: 1.0000 | ORAL_TABLET | Freq: Four times a day (QID) | ORAL | 0 refills | Status: DC | PRN

## 2021-07-21 NOTE — Telephone Encounter (Signed)
Med sent.

## 2021-07-29 ENCOUNTER — Ambulatory Visit: Payer: 59 | Admitting: Nurse Practitioner

## 2021-08-20 ENCOUNTER — Telehealth: Payer: Self-pay

## 2021-08-20 NOTE — Telephone Encounter (Signed)
Left vm to confirm 08/26/21 appointment-Toni 

## 2021-08-25 ENCOUNTER — Telehealth: Payer: Self-pay

## 2021-08-25 NOTE — Telephone Encounter (Signed)
I called patient yesterday to confirm pft appointment for 08/26/21. She stated she no longer has insurance asked if we could bill for test. Per Tat, patient will be self pay and payment in full expected day of test. I lvm to let patient know and that she must call office today if she needs to cancel. If she does not call to cancel and does not show for appointment. She may be subject to discharge-Toni

## 2021-08-26 ENCOUNTER — Ambulatory Visit: Payer: 59 | Admitting: Internal Medicine

## 2021-09-03 ENCOUNTER — Encounter: Payer: Self-pay | Admitting: Internal Medicine

## 2021-09-28 ENCOUNTER — Telehealth: Payer: Self-pay

## 2021-09-28 NOTE — Telephone Encounter (Signed)
Left vm and sent mychart message to confirm 09/29/21 appointment-Toni °

## 2021-09-29 ENCOUNTER — Encounter: Payer: BC Managed Care – PPO | Admitting: Nurse Practitioner

## 2021-10-01 ENCOUNTER — Telehealth: Payer: Self-pay

## 2021-10-01 NOTE — Telephone Encounter (Signed)
lvm to see if pt wants to r/s 10/07/21 PFTsince new insurance goes into effect 10/14/21-Toni

## 2021-10-04 IMAGING — MR MR CERVICAL SPINE W/O CM
5 series · 40 of 48 positions shown · non-contrast
Comparison: 12/12/2020.

CLINICAL DATA: Neck pain, radiating to right upper extremity.
Numbness and tingling.

EXAM:
MRI CERVICAL SPINE WITHOUT CONTRAST
TECHNIQUE: Multiplanar, multisequence MR imaging of the cervical spine was
performed. No intravenous contrast was administered.

[Series 5: T2 · sagittal · 3.0mm · 0.62mm/px · 8 of 15 slices shown (1 of 2)]
[im 1/15]
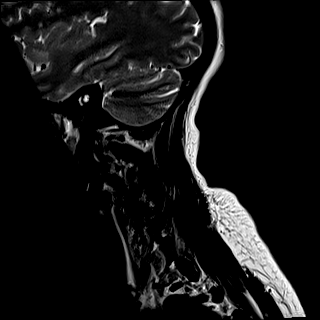
[im 3/15]
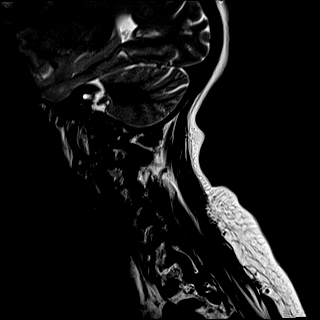
[im 5/15]
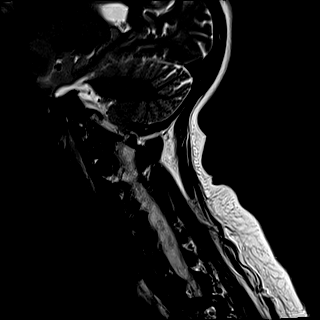
[im 7/15]
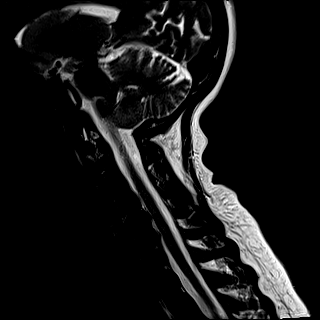
[im 9/15]
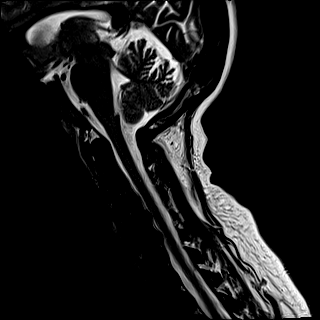
[im 11/15]
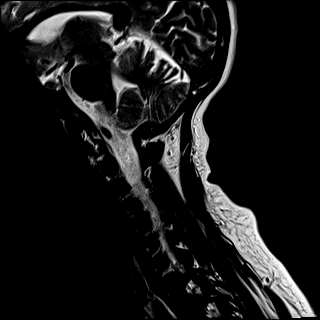
[im 13/15]
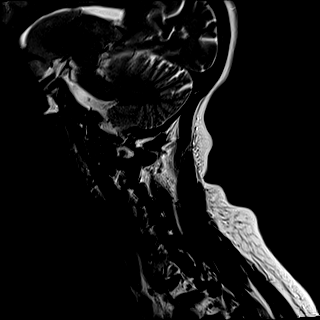
[im 15/15]
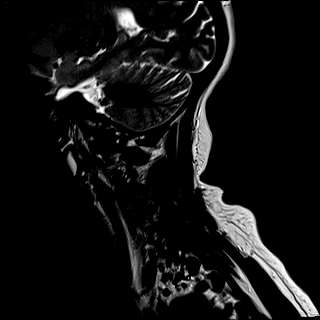

[Series 6: FLAIR · sagittal · 3.0mm · 0.78mm/px · 8 of 15 slices shown]
[im 1/15]
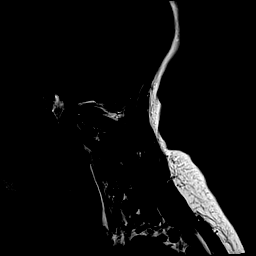
[im 3/15]
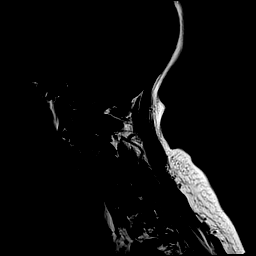
[im 5/15]
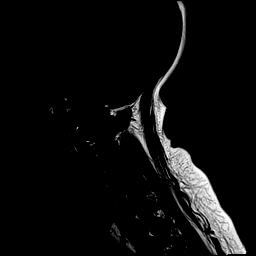
[im 7/15]
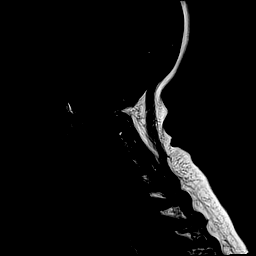
[im 9/15]
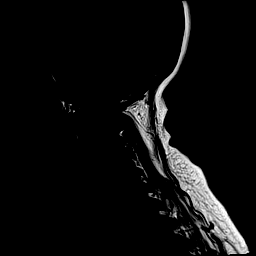
[im 11/15]
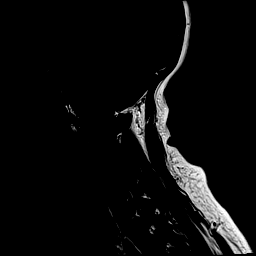
[im 13/15]
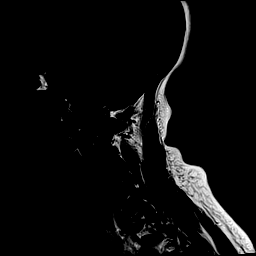
[im 15/15]
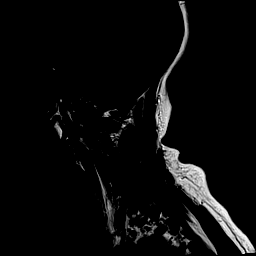

[Series 7: STIR · sagittal · 3.0mm · 0.62mm/px · 8 of 15 slices shown]
[im 1/15]
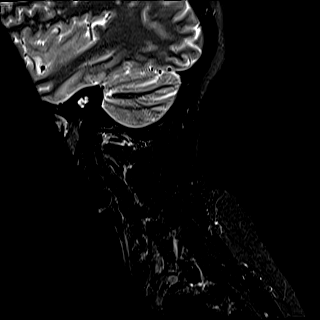
[im 3/15]
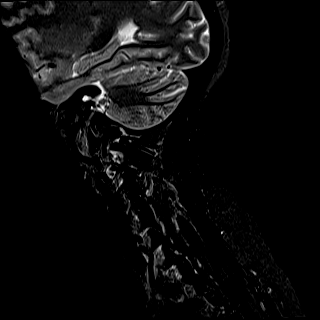
[im 5/15]
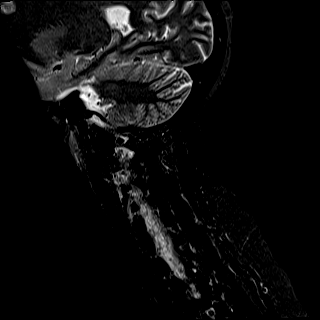
[im 7/15]
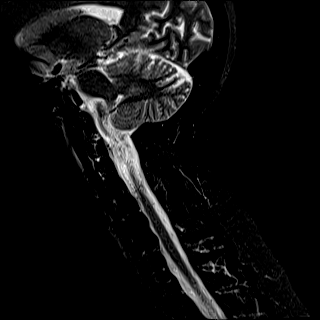
[im 9/15]
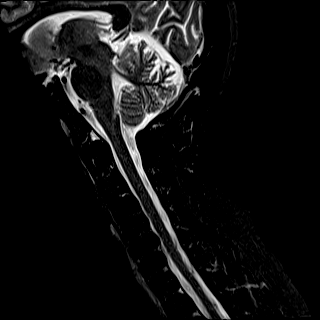
[im 11/15]
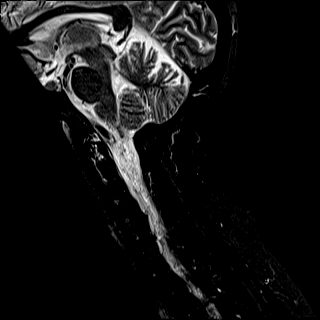
[im 13/15]
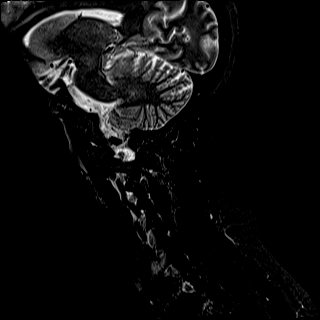
[im 15/15]
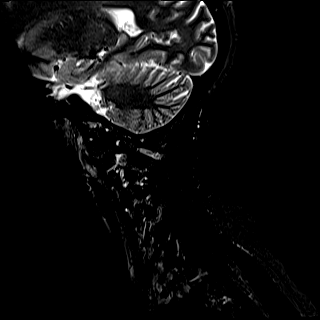

[Series 8: T2 · axial · 3.0mm · 0.70mm/px · z∈[-101,-28]mm · 9 of 24 slices shown (2 of 2)]
[im 1/24]
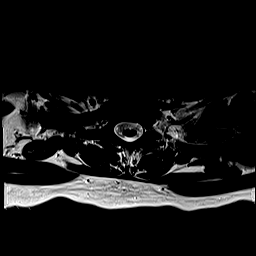
[im 5/24]
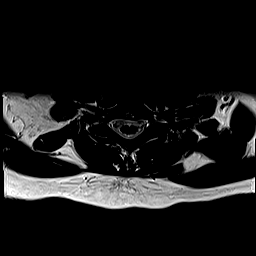
[im 7/24]
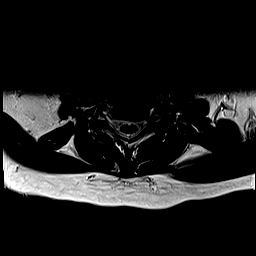
[im 11/24]
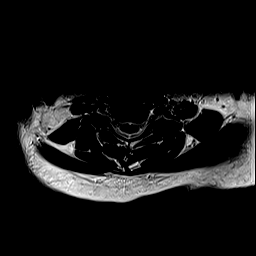
[im 13/24]
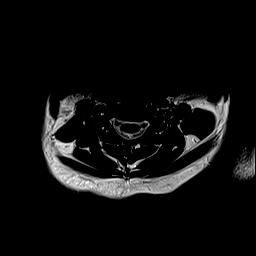
[im 17/24]
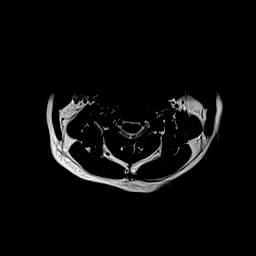
[im 19/24]
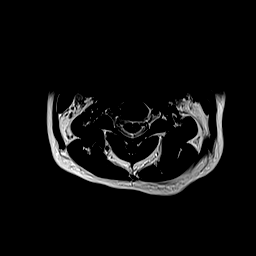
[im 21/24]
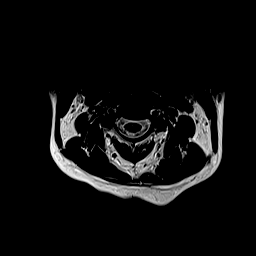
[im 24/24]
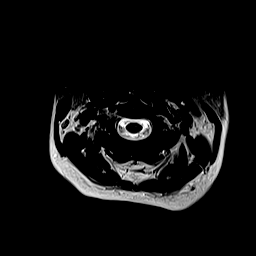

[Series 9: ax mpgr · axial · 3.0mm · 0.35mm/px · z∈[-101,-44]mm · 7 of 24 slices shown]
[im 1/24]
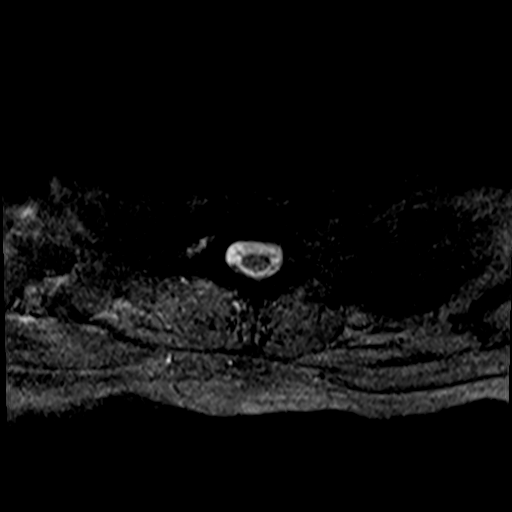
[im 5/24]
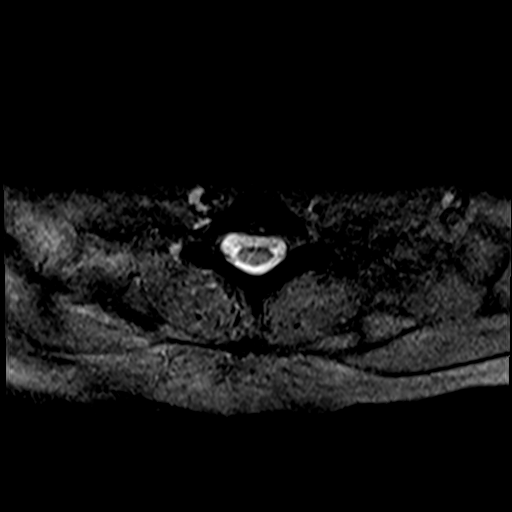
[im 7/24]
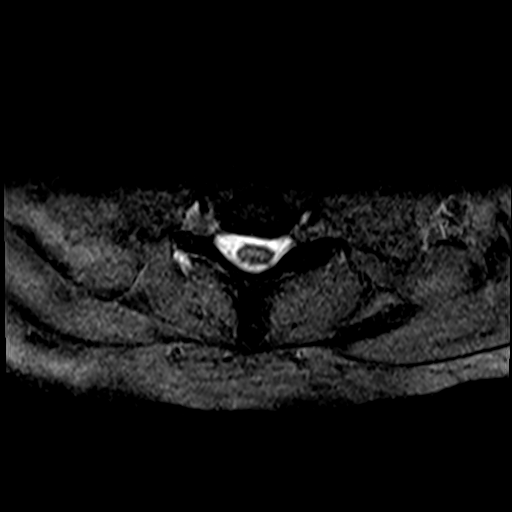
[im 11/24]
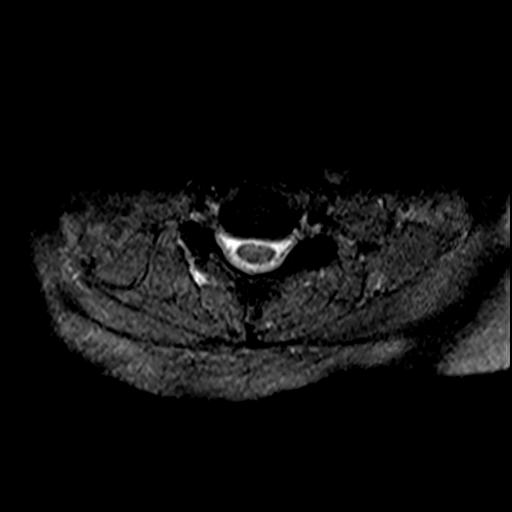
[im 13/24]
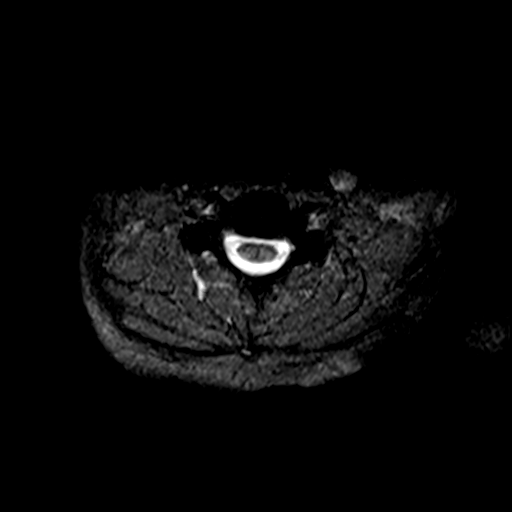
[im 17/24]
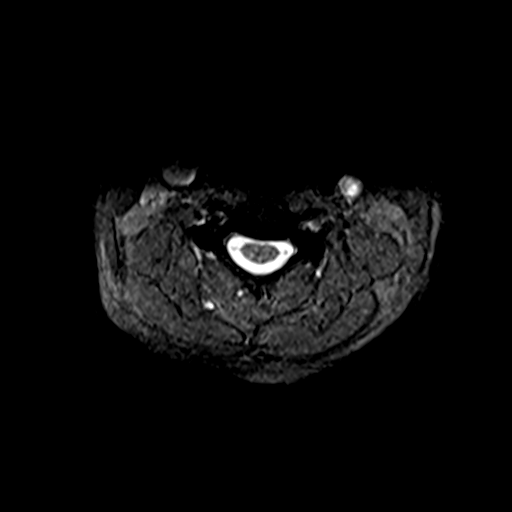
[im 19/24]
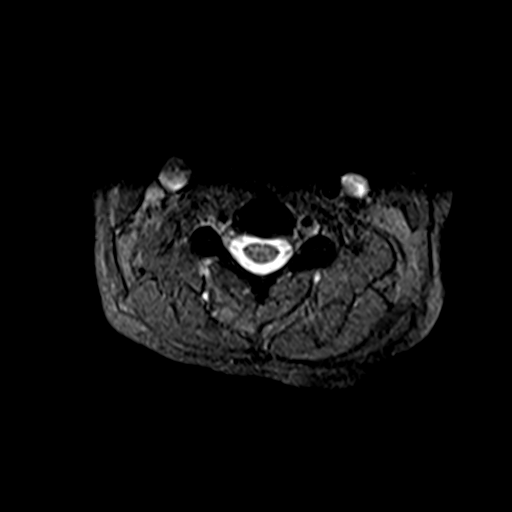

[40 of 48 positions shown; findings below may reference images not displayed]

FINDINGS: Alignment: Straightening of lordosis.

Vertebrae: Diffusely hypointense appearance of the bone marrow is
nonspecific. This can be seen in smoking, obesity,
anemia/alternative hematologic pathologies or renal disease. No
aggressive osseous lesion or fracture.

Cord: Normal signal and morphology.

Posterior Fossa, vertebral arteries: Please see recent MRI head for
findings above the foramen magnum. Normal flow voids.

Disc levels: Multilevel desiccation.

C2-3: Minimal uncovertebral and bilateral facet degenerative
spurring. Patent spinal canal and left neural foramen. Minimal right
neural foraminal narrowing.

C3-4: Uncovertebral and facet degenerative spurring. Patent spinal
canal and neural foramen.

C4-5: Small disc osteophyte complex with uncovertebral and facet
degenerative spurring. Patent spinal canal and left neural foramen.
Minimal right neural foraminal narrowing.

C5-6: Small disc osteophyte complex with uncovertebral and facet
degenerative spurring. Patent spinal canal and left neural foramen.
Mild right neural foraminal narrowing.

C6-7: No significant disc bulge. Patent spinal canal and neural
foramen.

C7-T1: Minimal left uncovertebral degenerative spurring. Patent
spinal canal and neural foramen.

Paraspinal tissues: Negative.
IMPRESSION: Minimal to mild right neural foraminal narrowing at the C2-3, C4-5
and C5-6 levels.

No significant spinal canal stenosis.

## 2021-10-07 ENCOUNTER — Ambulatory Visit: Payer: 59 | Admitting: Internal Medicine

## 2021-10-14 ENCOUNTER — Telehealth: Payer: Self-pay

## 2021-10-14 NOTE — Telephone Encounter (Signed)
Lvm requesting new insurance information-Toni

## 2021-10-21 ENCOUNTER — Ambulatory Visit: Payer: 59 | Admitting: Internal Medicine

## 2021-10-28 ENCOUNTER — Encounter: Payer: 59 | Admitting: Nurse Practitioner

## 2021-11-18 ENCOUNTER — Other Ambulatory Visit: Payer: Self-pay

## 2021-11-18 ENCOUNTER — Other Ambulatory Visit: Payer: Self-pay | Admitting: Nurse Practitioner

## 2021-11-18 ENCOUNTER — Ambulatory Visit: Payer: 59 | Admitting: Internal Medicine

## 2021-11-18 DIAGNOSIS — U099 Post covid-19 condition, unspecified: Secondary | ICD-10-CM | POA: Diagnosis not present

## 2021-11-18 DIAGNOSIS — R0609 Other forms of dyspnea: Secondary | ICD-10-CM

## 2021-11-18 DIAGNOSIS — R0602 Shortness of breath: Secondary | ICD-10-CM

## 2021-11-18 MED ORDER — ALBUTEROL SULFATE HFA 108 (90 BASE) MCG/ACT IN AERS
2.0000 | INHALATION_SPRAY | Freq: Four times a day (QID) | RESPIRATORY_TRACT | 0 refills | Status: DC | PRN
Start: 2021-11-18 — End: 2021-11-24

## 2021-11-18 NOTE — Telephone Encounter (Signed)
Only 30 days due to pt need appt for refills  ?

## 2021-11-22 NOTE — Procedures (Signed)
[facility] ?[address] ?[place], [address] ? ? ? ?Complete Pulmonary Function Testing Interpretation: ? ?FINDINGS: ? ?Forced vital capacity is mildly decreased.  FEV1 was 2.39 L which is 92% of predicted and is normal.  FEV1 FVC ratio is normal.  Postbronchodilator there is no significant change in the FEV1.  Total lung capacity is mildly decreased.  Residual volume is decreased.  Residual volume total lung capacity ratio is decreased.  FRC is decreased.  DLCO is normal. ? ?IMPRESSION: ? ?This pulmonary function study is suggestive of a mild restrictive lung disease however should be noted that there was some difficulty performing the lung volume maneuvers.  Overall spirometry however is within normal limits as well as the DLCO clinical correlation is recommended ? ?Yevonne Pax, MD FCCP ?Pulmonary Critical Care Medicine ?Sleep Medicine ? ?

## 2021-11-24 ENCOUNTER — Encounter: Payer: Self-pay | Admitting: Nurse Practitioner

## 2021-11-24 ENCOUNTER — Other Ambulatory Visit: Payer: Self-pay | Admitting: Nurse Practitioner

## 2021-11-24 ENCOUNTER — Ambulatory Visit (INDEPENDENT_AMBULATORY_CARE_PROVIDER_SITE_OTHER): Payer: 59 | Admitting: Nurse Practitioner

## 2021-11-24 ENCOUNTER — Other Ambulatory Visit: Payer: Self-pay

## 2021-11-24 VITALS — BP 133/90 | HR 84 | Temp 98.4°F | Resp 16 | Ht 66.0 in | Wt 193.2 lb

## 2021-11-24 DIAGNOSIS — G43809 Other migraine, not intractable, without status migrainosus: Secondary | ICD-10-CM | POA: Diagnosis not present

## 2021-11-24 DIAGNOSIS — E559 Vitamin D deficiency, unspecified: Secondary | ICD-10-CM

## 2021-11-24 DIAGNOSIS — Z1231 Encounter for screening mammogram for malignant neoplasm of breast: Secondary | ICD-10-CM

## 2021-11-24 DIAGNOSIS — M542 Cervicalgia: Secondary | ICD-10-CM

## 2021-11-24 DIAGNOSIS — J4540 Moderate persistent asthma, uncomplicated: Secondary | ICD-10-CM

## 2021-11-24 DIAGNOSIS — E119 Type 2 diabetes mellitus without complications: Secondary | ICD-10-CM

## 2021-11-24 DIAGNOSIS — Z1212 Encounter for screening for malignant neoplasm of rectum: Secondary | ICD-10-CM

## 2021-11-24 DIAGNOSIS — Z1211 Encounter for screening for malignant neoplasm of colon: Secondary | ICD-10-CM

## 2021-11-24 DIAGNOSIS — R0609 Other forms of dyspnea: Secondary | ICD-10-CM

## 2021-11-24 DIAGNOSIS — F331 Major depressive disorder, recurrent, moderate: Secondary | ICD-10-CM

## 2021-11-24 DIAGNOSIS — I10 Essential (primary) hypertension: Secondary | ICD-10-CM | POA: Diagnosis not present

## 2021-11-24 DIAGNOSIS — R300 Dysuria: Secondary | ICD-10-CM

## 2021-11-24 DIAGNOSIS — Z0001 Encounter for general adult medical examination with abnormal findings: Secondary | ICD-10-CM

## 2021-11-24 DIAGNOSIS — E782 Mixed hyperlipidemia: Secondary | ICD-10-CM

## 2021-11-24 DIAGNOSIS — R42 Dizziness and giddiness: Secondary | ICD-10-CM

## 2021-11-24 DIAGNOSIS — G8929 Other chronic pain: Secondary | ICD-10-CM

## 2021-11-24 DIAGNOSIS — U099 Post covid-19 condition, unspecified: Secondary | ICD-10-CM

## 2021-11-24 LAB — POCT GLYCOSYLATED HEMOGLOBIN (HGB A1C): Hemoglobin A1C: 6 % — AB (ref 4.0–5.6)

## 2021-11-24 MED ORDER — ACCU-CHEK GUIDE VI STRP: ORAL_STRIP | 2 refills | Status: AC

## 2021-11-24 MED ORDER — ALBUTEROL SULFATE HFA 108 (90 BASE) MCG/ACT IN AERS: 2.0000 | INHALATION_SPRAY | Freq: Four times a day (QID) | RESPIRATORY_TRACT | 0 refills | Status: DC | PRN

## 2021-11-24 MED ORDER — DAPAGLIFLOZIN PROPANEDIOL 10 MG PO TABS: 10.0000 mg | ORAL_TABLET | Freq: Every day | ORAL | 1 refills | Status: AC

## 2021-11-24 MED ORDER — MECLIZINE HCL 25 MG PO TABS: 25.0000 mg | ORAL_TABLET | Freq: Two times a day (BID) | ORAL | 1 refills | Status: DC | PRN

## 2021-11-24 MED ORDER — CYCLOBENZAPRINE HCL 10 MG PO TABS: 10.0000 mg | ORAL_TABLET | Freq: Three times a day (TID) | ORAL | 0 refills | Status: DC | PRN

## 2021-11-24 MED ORDER — BUDESONIDE-FORMOTEROL FUMARATE 80-4.5 MCG/ACT IN AERO: 2.0000 | INHALATION_SPRAY | Freq: Two times a day (BID) | RESPIRATORY_TRACT | 5 refills | Status: DC

## 2021-11-24 MED ORDER — AMLODIPINE BESYLATE 10 MG PO TABS: 10.0000 mg | ORAL_TABLET | Freq: Every day | ORAL | 1 refills | Status: DC

## 2021-11-24 MED ORDER — HYDROCHLOROTHIAZIDE 25 MG PO TABS: 25.0000 mg | ORAL_TABLET | Freq: Every day | ORAL | 1 refills | Status: DC

## 2021-11-24 MED ORDER — ACCU-CHEK SOFTCLIX LANCETS MISC: 12 refills | Status: AC

## 2021-11-24 MED ORDER — BUTALBITAL-APAP-CAFFEINE 50-325-40 MG PO TABS: 1.0000 | ORAL_TABLET | Freq: Four times a day (QID) | ORAL | 0 refills | Status: DC | PRN

## 2021-11-24 NOTE — Progress Notes (Signed)
[facility] ?[address] ?[place], [place] [address] ? ?Internal MEDICINE  ?Office Visit Note ? ?Patient Name: Melanie Briggs ? [id number]  ?[id number] ? ?Date of Service: 11/24/2021 ? ?Chief Complaint  ?Patient presents with  ? Annual Exam  ?  Wants to register pet as service animal  ? Medication Refill  ? Results  ?  Lung test  ? ? ?HPI ?Melanie Briggs presents for an annual well visit and physical exam.  She is a well-appearing 51 year old female with hypertension, migraines, asthma, anxiety and depression, type 2 diabetes and vertigo.  Patient had a period of time over the last few months when she did not have any insurance and has run out of several of her medications and needs refills on all of them.  She reports that even though she has not been able to get all of her medications she has done her best to eat a healthy diet and stay hydrated by drinking a lot of water and staying away from unhealthy drinks and foods that she does not need to be eating. ?Her A1c is 6.0 today which is improved from 6.2 in October last year.  Although her A1c did improve her weight has increased by 7 pounds.  Her weight at her previous office visit was 186 pounds.  Blood pressure is slightly elevated but stable.  Patient reports she has not had any of her blood pressure medications.  She was taking hydrochlorothiazide 25 mg daily, amlodipine 5 mg daily and metoprolol succinate 50 mg daily. ?She had a pulmonary function test done that did show mild restrictive lung disease.  Patient has a rescue inhaler but has never been on a maintenance inhaler.  Patient reports that she has had to use her rescue inhaler more and has been experiencing some shortness of breath at times.  Patient is requesting to start on a maintenance inhaler if it is recommended. ?Patient has experienced a death in the family of someone she is very close to patient and her partner have a new puppy that is a [place] terrier and is requesting a letter to have  permission to use this pet as an emotional support animal.  She is requesting this letter because she wants to be able to take her [place] terrier with her when she goes places. ?Discussed pulmonary function test with patient, results were read by Dr. Freda Munro, findings were consistent with mild restrictive lung disease. ? ? ?Current Medication: ?Outpatient Encounter Medications as of 11/24/2021  ?Medication Sig  ? acetaminophen (TYLENOL) 500 MG tablet Take 1,000 mg by mouth daily as needed for headache.   ? amLODipine (NORVASC) 10 MG tablet Take 1 tablet (10 mg total) by mouth daily.  ? budesonide-formoterol (SYMBICORT) 80-4.5 MCG/ACT inhaler Inhale 2 puffs into the lungs in the morning and at bedtime.  ? diphenhydrAMINE (BENADRYL) 25 MG tablet Take 25 mg by mouth every 6 (six) hours as needed (allergic reaction).  ? lidocaine (XYLOCAINE) 2 % solution Use as directed 5 mLs in the mouth or throat every 6 (six) hours as needed for mouth pain.  ? [DISCONTINUED] Accu-Chek Softclix Lancets lancets Use to check blood sugars twice a day  E11.65  ? [DISCONTINUED] albuterol (VENTOLIN HFA) 108 (90 Base) MCG/ACT inhaler Inhale 2 puffs into the lungs every 6 (six) hours as needed for wheezing or shortness of breath.  ? [DISCONTINUED] amLODipine (NORVASC) 5 MG tablet Take 1 tablet (5 mg total) by mouth daily.  ? [DISCONTINUED] butalbital-acetaminophen-caffeine (FIORICET) 50-325-40 MG tablet Take  1 tablet by mouth every 6 (six) hours as needed for headache.  ? [DISCONTINUED] cyclobenzaprine (FLEXERIL) 10 MG tablet Take 1 tablet (10 mg total) by mouth 3 (three) times daily as needed for muscle spasms.  ? [DISCONTINUED] dapagliflozin propanediol (FARXIGA) 10 MG TABS tablet Take 1 tablet (10 mg total) by mouth daily before breakfast.  ? [DISCONTINUED] glucose blood (ACCU-CHEK GUIDE) test strip Use to check blood sugars twice a day  E11.65  ? [DISCONTINUED] hydrochlorothiazide (HYDRODIURIL) 25 MG tablet Take 1 tablet (25 mg  total) by mouth daily.  ? [DISCONTINUED] meclizine (ANTIVERT) 25 MG tablet Take 1 tablet (25 mg total) by mouth 2 (two) times daily as needed for dizziness.  ? [DISCONTINUED] metoprolol succinate (TOPROL-XL) 50 MG 24 hr tablet Take 1 tablet (50 mg total) by mouth daily. Take with or immediately following a meal.  ? Accu-Chek Softclix Lancets lancets Use to check blood sugars twice a day  E11.65  ? albuterol (VENTOLIN HFA) 108 (90 Base) MCG/ACT inhaler Inhale 2 puffs into the lungs every 6 (six) hours as needed for wheezing or shortness of breath.  ? butalbital-acetaminophen-caffeine (FIORICET) 50-325-40 MG tablet Take 1 tablet by mouth every 6 (six) hours as needed for headache.  ? cyclobenzaprine (FLEXERIL) 10 MG tablet Take 1 tablet (10 mg total) by mouth 3 (three) times daily as needed for muscle spasms.  ? dapagliflozin propanediol (FARXIGA) 10 MG TABS tablet Take 1 tablet (10 mg total) by mouth daily before breakfast.  ? glucose blood (ACCU-CHEK GUIDE) test strip Use to check blood sugars twice a day  E11.65  ? hydrochlorothiazide (HYDRODIURIL) 25 MG tablet Take 1 tablet (25 mg total) by mouth daily.  ? meclizine (ANTIVERT) 25 MG tablet Take 1 tablet (25 mg total) by mouth 2 (two) times daily as needed for dizziness.  ? ?No facility-administered encounter medications on file as of 11/24/2021.  ? ? ?Surgical History: ?Past Surgical History:  ?Procedure Laterality Date  ? ABDOMINAL HYSTERECTOMY    ? TUMOR EXCISION N/A   ? ? ?Medical History: ?Past Medical History:  ?Diagnosis Date  ? Bronchitis   ? Depression   ? Diabetes mellitus without complication (HCC)   ? Hypertension   ? Migraine   ? ? ?Family History: ?Family History  ?Problem Relation Age of Onset  ? Diabetes Other   ? Diabetes Mother   ? Kidney failure Mother   ? Pancreatitis Mother   ? Hyperlipidemia Father   ? ? ?Social History  ? ?Socioeconomic History  ? Marital status: Single  ?  Spouse name: Not on file  ? Number of children: Not on file  ? Years  of education: Not on file  ? Highest education level: Not on file  ?Occupational History  ? Not on file  ?Tobacco Use  ? Smoking status: Former  ?  Years: 3.00  ?  Types: Cigarettes  ? Smokeless tobacco: Never  ?Substance and Sexual Activity  ? Alcohol use: Yes  ?  Comment: socailly  ? Drug use: No  ? Sexual activity: Not on file  ?Other Topics Concern  ? Not on file  ?Social History Narrative  ? Not on file  ? ?Social Determinants of Health  ? ?Financial Resource Strain: Not on file  ?Food Insecurity: Not on file  ?Transportation Needs: Not on file  ?Physical Activity: Not on file  ?Stress: Not on file  ?Social Connections: Not on file  ?Intimate Partner Violence: Not on file  ? ? ? ? ?Review of  Systems  ?Constitutional:  Negative for activity change, appetite change, chills, fatigue, fever and unexpected weight change.  ?HENT: Negative.  Negative for congestion, ear pain, rhinorrhea, sore throat and trouble swallowing.   ?Eyes: Negative.   ?Respiratory: Negative.  Negative for cough, chest tightness, shortness of breath and wheezing.   ?Cardiovascular: Negative.  Negative for chest pain and palpitations.  ?Gastrointestinal: Negative.  Negative for abdominal pain, blood in stool, constipation, diarrhea, nausea and vomiting.  ?Endocrine: Negative.   ?Genitourinary: Negative.  Negative for difficulty urinating, dysuria, frequency, hematuria and urgency.  ?Musculoskeletal: Negative.  Negative for arthralgias, back pain, joint swelling, myalgias and neck pain.  ?Skin: Negative.  Negative for rash and wound.  ?Allergic/Immunologic: Negative.  Negative for immunocompromised state.  ?Neurological: Negative.  Negative for dizziness, seizures, numbness and headaches.  ?Hematological: Negative.   ?Psychiatric/Behavioral:  Positive for behavioral problems and sleep disturbance. Negative for self-injury and suicidal ideas. The patient is nervous/anxious.   ? ?Vital Signs: ?BP (!) 133/92   Pulse 84   Temp 98.4 ?F (36.9 ?C)    Resp 16   Ht  (1.676 m)   Wt 193 lb 3.2 oz (87.6 kg)   SpO2 96%   BMI 31.18 kg/m?  ? ? ?Physical Exam ?Vitals reviewed.  ?Constitutional:   ?   General: She is awake. She is not in acute distress. ?   A

## 2021-11-27 ENCOUNTER — Encounter: Payer: Self-pay | Admitting: Internal Medicine

## 2021-11-27 LAB — PULMONARY FUNCTION TEST

## 2021-12-24 ENCOUNTER — Ambulatory Visit: Payer: 59 | Admitting: Nurse Practitioner

## 2022-03-15 ENCOUNTER — Telehealth: Payer: Self-pay

## 2022-03-15 NOTE — Telephone Encounter (Signed)
Letter for emotional support animal given to patient and a copy of the letter has been placed in scan.

## 2022-11-29 ENCOUNTER — Encounter: Payer: Self-pay | Admitting: Nurse Practitioner

## 2024-07-08 ENCOUNTER — Emergency Department

## 2024-07-08 ENCOUNTER — Emergency Department
Admission: EM | Admit: 2024-07-08 | Discharge: 2024-07-08 | Disposition: A | Discharge status: Home / Self Care | Attending: Emergency Medicine | Admitting: Emergency Medicine

## 2024-07-08 ENCOUNTER — Other Ambulatory Visit: Payer: Self-pay

## 2024-07-08 DIAGNOSIS — W01198A Fall on same level from slipping, tripping and stumbling with subsequent striking against other object, initial encounter: Secondary | ICD-10-CM | POA: Insufficient documentation

## 2024-07-08 DIAGNOSIS — I10 Essential (primary) hypertension: Secondary | ICD-10-CM | POA: Insufficient documentation

## 2024-07-08 DIAGNOSIS — S0990XA Unspecified injury of head, initial encounter: Secondary | ICD-10-CM | POA: Insufficient documentation

## 2024-07-08 DIAGNOSIS — R7401 Elevation of levels of liver transaminase levels: Secondary | ICD-10-CM | POA: Insufficient documentation

## 2024-07-08 DIAGNOSIS — K760 Fatty (change of) liver, not elsewhere classified: Secondary | ICD-10-CM | POA: Insufficient documentation

## 2024-07-08 DIAGNOSIS — W19XXXA Unspecified fall, initial encounter: Secondary | ICD-10-CM

## 2024-07-08 LAB — COMPREHENSIVE METABOLIC PANEL WITH GFR
ALT: 133 U/L — ABNORMAL HIGH (ref 0–44)
AST: 158 U/L — ABNORMAL HIGH (ref 15–41)
Albumin: 4.2 g/dL (ref 3.5–5.0)
Alkaline Phosphatase: 63 U/L (ref 38–126)
Anion gap: 16 — ABNORMAL HIGH (ref 5–15)
BUN: 15 mg/dL (ref 6–20)
CO2: 19 mmol/L — ABNORMAL LOW (ref 22–32)
Calcium: 9.4 mg/dL (ref 8.9–10.3)
Chloride: 100 mmol/L (ref 98–111)
Creatinine, Ser: 0.8 mg/dL (ref 0.44–1.00)
GFR, Estimated: >60 mL/min (ref >60)
Glucose, Bld: 99 mg/dL (ref 70–99)
Potassium: 5.5 mmol/L — ABNORMAL HIGH (ref 3.5–5.1)
Sodium: 135 mmol/L (ref 135–145)
Total Bilirubin: 1.5 mg/dL — ABNORMAL HIGH (ref 0.0–1.2)
Total Protein: 9 g/dL — ABNORMAL HIGH (ref 6.5–8.1)

## 2024-07-08 LAB — CBC
HCT: 44.1 % (ref 36.0–46.0)
Hemoglobin: 14.3 g/dL (ref 12.0–15.0)
MCH: 32.9 pg (ref 26.0–34.0)
MCHC: 32.4 g/dL (ref 30.0–36.0)
MCV: 101.4 fL — ABNORMAL HIGH (ref 80.0–100.0)
Platelets: 207 K/uL (ref 150–400)
RBC: 4.35 MIL/uL (ref 3.87–5.11)
RDW: 11.5 % (ref 11.5–15.5)
WBC: 8.7 K/uL (ref 4.0–10.5)
nRBC: 0 % (ref 0.0–0.2)

## 2024-07-08 MED ORDER — ACETAMINOPHEN 325 MG PO TABS
650.0000 mg | ORAL_TABLET | Freq: Once | ORAL | Status: AC
  Administered 2024-07-08: 650 mg via ORAL
  Filled 2024-07-08: qty 2

## 2024-07-08 NOTE — ED Triage Notes (Signed)
 Pt presents to the ED via POV from home. Pt reports a fall last night. Pt states that she lost balance her balance and fell hitting her head on the ground. Pt's visitor states that she was unresponsive for about 1.5 minutes after the fall. Denies use of blood thinner.

## 2024-07-08 NOTE — ED Provider Notes (Signed)
 [facility] Provider Note    Event Date/Time   First MD Initiated Contact with Patient 07/08/24 1004     (approximate)   History   Fall   HPI  Melanie Briggs is a 53 y.o. female who presents today for evaluation after a fall that occurred yesterday.  Patient reports that she had 2 alcoholic beverages at a party and turned quickly and lost her balance and fell and struck her head.  She reports that bystanders think that she lost consciousness but she cannot recall what happened after the event.  She denies any vomiting.  She reports that she has a mild left-sided headache now.  She reports that she drinks alcohol daily but has never had tremulousness or seizures if she stops drinking.  She reports that she has mild discomfort to her upper back/neck area.  No numbness or tingling or weakness in her extremities.  No visual changes.  No abdominal pain, nausea, vomiting, diarrhea.  She reports that she took 6 Tylenol  yesterday, 1000 mg total.  Patient Active Problem List   Diagnosis Date Noted   Anxiety and depression 07/11/2020   Neck pain 11/24/2017   Essential hypertension 09/13/2010   Vestibular migraine 02/12/1995          Physical Exam   Triage Vital Signs: ED Triage Vitals [07/08/24 0952]  Encounter Vitals Group     BP (!) 172/96     Girls Systolic BP Percentile      Girls Diastolic BP Percentile      Boys Systolic BP Percentile      Boys Diastolic BP Percentile      Pulse Rate 81     Resp 18     Temp 98.3 F (36.8 C)     Temp Source Oral     SpO2 98 %     Weight 198 lb (89.8 kg)     Height 5' 3 (1.6 m)     Head Circumference      Peak Flow      Pain Score 10     Pain Loc      Pain Education      Exclude from Growth Chart     Most recent vital signs: Vitals:   07/08/24 0952 07/08/24 1113  BP: (!) 172/96   Pulse: 81   Resp: 18   Temp: 98.3 F (36.8 C)   SpO2: 98% 98%    Physical Exam Vitals and nursing note reviewed.   Constitutional:      General: Awake and alert. No acute distress.    Appearance: Normal appearance. The patient is normal weight.  HENT:     Head: Normocephalic and atraumatic. No hematoma, ecchymosis, erythema, or wounds noted    Mouth: Mucous membranes are moist.  Eyes:     General: PERRL. Normal EOMs        Right eye: No discharge.        Left eye: No discharge.     Conjunctiva/sclera: Conjunctivae normal.  Cardiovascular:     Rate and Rhythm: Normal rate and regular rhythm.     Pulses: Normal pulses.  Pulmonary:     Effort: Pulmonary effort is normal. No respiratory distress.     Breath sounds: Normal breath sounds.  Abdominal:     Abdomen is soft. There is no abdominal tenderness. No rebound or guarding. No distention. Musculoskeletal:        General: No swelling. Normal range of motion.     Cervical back:  Normal range of motion and neck supple. No midline cervical spine tenderness.  Full range of motion of neck.  Negative Spurling test.  Negative Lhermitte sign.  Normal strength and sensation in bilateral upper extremities. Normal grip strength bilaterally.  Normal intrinsic muscle function of the hand bilaterally.  Normal radial pulses bilaterally. Skin:    General: Skin is warm and dry.     Capillary Refill: Capillary refill takes less than 2 seconds.     Findings: No rash.  Neurological:     Mental Status: The patient is awake and alert.   Neurological: GCS 15 alert and oriented x3 Normal speech, no expressive or receptive aphasia or dysarthria Cranial nerves II through XII intact Normal visual fields 5 out of 5 strength in all 4 extremities with intact sensation throughout No extremity drift Normal finger-to-nose testing, no limb or truncal ataxia    ED Results / Procedures / Treatments   Labs (all labs ordered are listed, but only abnormal results are displayed) Labs Reviewed  COMPREHENSIVE METABOLIC PANEL WITH GFR - Abnormal; Notable for the following  components:      Result Value   Potassium 5.5 (*)    CO2 19 (*)    Total Protein 9.0 (*)    AST 158 (*)    ALT 133 (*)    Total Bilirubin 1.5 (*)    Anion gap 16 (*)    All other components within normal limits  CBC - Abnormal; Notable for the following components:   MCV 101.4 (*)    All other components within normal limits     EKG     RADIOLOGY I independently reviewed and interpreted imaging and agree with radiologists findings.     PROCEDURES:  Critical Care performed:   Procedures   MEDICATIONS ORDERED IN ED: Medications  acetaminophen  (TYLENOL ) tablet 650 mg (650 mg Oral Given 07/08/24 1230)     IMPRESSION / MDM / ASSESSMENT AND PLAN / ED COURSE  I reviewed the triage vital signs and the nursing notes.   Differential diagnosis includes, but is not limited to, concussion, contusion, alcohol intoxication, intracranial hemorrhage, cervical spine injury  Patient is awake and alert, hemodynamically stable and afebrile.  She has a GCS of 15 and no focal neurological deficits.  She has a family member at the bedside.  CT head and neck obtained per [place] criteria are negative for any acute findings.  Patient is reassured by these findings.  Labs were obtained in triage, and reveal an elevated AST to ALT and an elevated bili to 1.5.  She has no tenderness on exam anywhere in her abdomen, with special attention paid to her right upper quadrant.  She does endorse daily alcohol consumption.  Denies any history of alcohol withdrawal.  No pain with eating, no nausea or vomiting.  She had elevated AST/ALT previously, though today slightly higher.  She reports that she had 1000 mg of Tylenol  yesterday, and no Tylenol  prior to that, no Tylenol  today.  Therefore do not suspect Tylenol  overdose.  Right upper quadrant ultrasound obtained does reveal fatty infiltration of the liver, though normal ducts and no stones.  Discussed this finding with the patient and recommendations for  decreased alcohol consumption and dietary modifications.  I also advised that she follow-up with gastroenterology/outpatient provider.  Labs otherwise are overall reassuring, she had an elevated potassium to 5.5, however this was hemolyzed.  She has no EKG abnormalities and did not wish to have another repeat blood test.  We  discussed return precautions and the importance of close outpatient follow-up.  Patient understands and agrees with plan.  She was discharged in stable condition.   Patient's presentation is most consistent with acute presentation with potential threat to life or bodily function.     FINAL CLINICAL IMPRESSION(S) / ED DIAGNOSES   Final diagnoses:  Fall, initial encounter  Injury of head, initial encounter  Fatty liver     Rx / DC Orders   ED Discharge Orders     None        Note:  This document was prepared using Dragon voice recognition software and may include unintentional dictation errors.   Carmel Garfield E, PA-C 07/08/24 1330    Arlander Charleston, MD 07/08/24 1407

## 2024-07-08 NOTE — Discharge Instructions (Signed)
 Please follow-up with your outpatient provider.  Please return for any new, worsening, or changing symptoms or other concerns.

## 2024-07-08 NOTE — ED Notes (Signed)
 See triage note  Presents s/p trip and fall last pm    States hit her head on the ground  Possible LOC for about 2 mins

## 2024-07-13 ENCOUNTER — Ambulatory Visit: Admitting: Nurse Practitioner

## 2024-07-13 ENCOUNTER — Encounter: Payer: Self-pay | Admitting: Nurse Practitioner

## 2024-07-13 VITALS — BP 138/72 | HR 90 | Temp 98.1°F | Resp 16 | Ht 63.0 in | Wt 193.2 lb

## 2024-07-13 DIAGNOSIS — E1159 Type 2 diabetes mellitus with other circulatory complications: Secondary | ICD-10-CM | POA: Diagnosis not present

## 2024-07-13 DIAGNOSIS — E875 Hyperkalemia: Secondary | ICD-10-CM

## 2024-07-13 DIAGNOSIS — G44229 Chronic tension-type headache, not intractable: Secondary | ICD-10-CM

## 2024-07-13 DIAGNOSIS — I152 Hypertension secondary to endocrine disorders: Secondary | ICD-10-CM

## 2024-07-13 DIAGNOSIS — R42 Dizziness and giddiness: Secondary | ICD-10-CM

## 2024-07-13 DIAGNOSIS — H02402 Unspecified ptosis of left eyelid: Secondary | ICD-10-CM

## 2024-07-13 DIAGNOSIS — E1169 Type 2 diabetes mellitus with other specified complication: Secondary | ICD-10-CM

## 2024-07-13 DIAGNOSIS — E559 Vitamin D deficiency, unspecified: Secondary | ICD-10-CM

## 2024-07-13 DIAGNOSIS — H5712 Ocular pain, left eye: Secondary | ICD-10-CM

## 2024-07-13 DIAGNOSIS — K760 Fatty (change of) liver, not elsewhere classified: Secondary | ICD-10-CM

## 2024-07-13 DIAGNOSIS — J4540 Moderate persistent asthma, uncomplicated: Secondary | ICD-10-CM

## 2024-07-13 DIAGNOSIS — M542 Cervicalgia: Secondary | ICD-10-CM

## 2024-07-13 DIAGNOSIS — E538 Deficiency of other specified B group vitamins: Secondary | ICD-10-CM

## 2024-07-13 DIAGNOSIS — Z9181 History of falling: Secondary | ICD-10-CM | POA: Insufficient documentation

## 2024-07-13 DIAGNOSIS — I10 Essential (primary) hypertension: Secondary | ICD-10-CM

## 2024-07-13 DIAGNOSIS — J410 Simple chronic bronchitis: Secondary | ICD-10-CM

## 2024-07-13 DIAGNOSIS — E119 Type 2 diabetes mellitus without complications: Secondary | ICD-10-CM | POA: Insufficient documentation

## 2024-07-13 DIAGNOSIS — G8929 Other chronic pain: Secondary | ICD-10-CM

## 2024-07-13 MED ORDER — CYCLOBENZAPRINE HCL 10 MG PO TABS: 10.0000 mg | ORAL_TABLET | Freq: Three times a day (TID) | ORAL | 0 refills | Status: AC | PRN

## 2024-07-13 MED ORDER — BUDESONIDE-FORMOTEROL FUMARATE 80-4.5 MCG/ACT IN AERO: 2.0000 | INHALATION_SPRAY | Freq: Two times a day (BID) | RESPIRATORY_TRACT | 5 refills | Status: AC

## 2024-07-13 MED ORDER — HYDROCHLOROTHIAZIDE 25 MG PO TABS: 25.0000 mg | ORAL_TABLET | Freq: Every day | ORAL | 1 refills | Status: AC

## 2024-07-13 MED ORDER — MECLIZINE HCL 25 MG PO TABS
25.0000 mg | ORAL_TABLET | Freq: Two times a day (BID) | ORAL | 1 refills | Status: AC | PRN
Start: 2024-07-13 — End: ?

## 2024-07-13 MED ORDER — AMLODIPINE BESYLATE 10 MG PO TABS: 10.0000 mg | ORAL_TABLET | Freq: Every day | ORAL | 1 refills | Status: AC

## 2024-07-13 MED ORDER — BUTALBITAL-APAP-CAFFEINE 50-325-40 MG PO TABS: 1.0000 | ORAL_TABLET | Freq: Four times a day (QID) | ORAL | 0 refills | Status: AC | PRN

## 2024-07-13 MED ORDER — ALBUTEROL SULFATE HFA 108 (90 BASE) MCG/ACT IN AERS: 2.0000 | INHALATION_SPRAY | Freq: Four times a day (QID) | RESPIRATORY_TRACT | 5 refills | Status: AC | PRN

## 2024-07-13 NOTE — Progress Notes (Signed)
 [facility] [address] [place], [place] [address]  Internal MEDICINE  Office Visit Note  Patient Name: Melanie Briggs  [id number]  [id number]  Date of Service: 07/13/2024  Chief Complaint  Patient presents with   Follow-up    ED f/u head- swelling and bruise     HPI Melanie Briggs presents for a follow-up visit for recent ED visit for head injury due to fall.  Injury to head -- seen in ED after a fall. CT head is negative for acute abnormalities.  History of recent fall Fatty liver -- seen on CT scan, also has very high liver enzymes Hyperkalemia -- potassium level is elevated at 5.5 in the ED  Type 2 diabetes -- has been managing diet, has not been on any prescription medications for more than a year. Was previously taking farxiga .  Chronic tension headaches -- fioricet has helped with acute headaches in the past.  Chronic neck pain -- takes cyclobenzaprine  as needed.  Acute left eye pain and ptosis of the left upper eye lid. Since the fall, she has noticed pain with movement of the eye. She denies any pressure in or behind the eye and denies any significant change in her vision except that it does get a little blurry sometimes.  Asthma -- takes symbicort  twice daily.    Fibrosis 4 Score = 3.31 (High risk)      Interpretation for patients with NAFLD          <1.30       -  F0-F1 (Low risk)          1.30-2.67 -  Indeterminate           >2.67      -  F3-F4 (High risk)      Validated for ages 62-65           Current Medication: Outpatient Encounter Medications as of 07/13/2024  Medication Sig   Accu-Chek Softclix Lancets lancets Use to check blood sugars twice a day  E11.65   acetaminophen  (TYLENOL ) 500 MG tablet Take 1,000 mg by mouth daily as needed for headache.    albuterol  (VENTOLIN  HFA) 108 (90 Base) MCG/ACT inhaler Inhale 2 puffs into the lungs every 6 (six) hours as needed for wheezing or shortness of breath.   amLODipine  (NORVASC ) 10 MG tablet Take 1 tablet  (10 mg total) by mouth daily.   budesonide -formoterol  (SYMBICORT ) 80-4.5 MCG/ACT inhaler Inhale 2 puffs into the lungs in the morning and at bedtime.   butalbital -acetaminophen -caffeine  (FIORICET) 50-325-40 MG tablet Take 1 tablet by mouth every 6 (six) hours as needed for headache.   cyclobenzaprine  (FLEXERIL ) 10 MG tablet Take 1 tablet (10 mg total) by mouth 3 (three) times daily as needed for muscle spasms.   dapagliflozin  propanediol (FARXIGA ) 10 MG TABS tablet Take 1 tablet (10 mg total) by mouth daily before breakfast.   diphenhydrAMINE  (BENADRYL ) 25 MG tablet Take 25 mg by mouth every 6 (six) hours as needed (allergic reaction).   glucose blood (ACCU-CHEK GUIDE) test strip Use to check blood sugars twice a day  E11.65   hydrochlorothiazide  (HYDRODIURIL ) 25 MG tablet Take 1 tablet (25 mg total) by mouth daily.   lidocaine  (XYLOCAINE ) 2 % solution Use as directed 5 mLs in the mouth or throat every 6 (six) hours as needed for mouth pain.   meclizine  (ANTIVERT ) 25 MG tablet Take 1 tablet (25 mg total) by mouth 2 (two) times daily as needed for dizziness.   [DISCONTINUED] albuterol  (VENTOLIN  HFA)  108 (90 Base) MCG/ACT inhaler Inhale 2 puffs into the lungs every 6 (six) hours as needed for wheezing or shortness of breath.   [DISCONTINUED] amLODipine  (NORVASC ) 10 MG tablet Take 1 tablet (10 mg total) by mouth daily.   [DISCONTINUED] budesonide -formoterol  (SYMBICORT ) 80-4.5 MCG/ACT inhaler Inhale 2 puffs into the lungs in the morning and at bedtime.   [DISCONTINUED] butalbital -acetaminophen -caffeine  (FIORICET) 50-325-40 MG tablet Take 1 tablet by mouth every 6 (six) hours as needed for headache.   [DISCONTINUED] cyclobenzaprine  (FLEXERIL ) 10 MG tablet Take 1 tablet (10 mg total) by mouth 3 (three) times daily as needed for muscle spasms.   [DISCONTINUED] hydrochlorothiazide  (HYDRODIURIL ) 25 MG tablet Take 1 tablet (25 mg total) by mouth daily.   [DISCONTINUED] meclizine  (ANTIVERT ) 25 MG tablet Take 1  tablet (25 mg total) by mouth 2 (two) times daily as needed for dizziness.   No facility-administered encounter medications on file as of 07/13/2024.    Surgical History: Past Surgical History:  Procedure Laterality Date   ABDOMINAL HYSTERECTOMY     TUMOR EXCISION N/A     Medical History: Past Medical History:  Diagnosis Date   Bronchitis    Depression    Diabetes mellitus without complication (HCC)    Hypertension    Migraine     Family History: Family History  Problem Relation Age of Onset   Diabetes Other    Diabetes Mother    Kidney failure Mother    Pancreatitis Mother    Hyperlipidemia Father     Social History   Socioeconomic History   Marital status: Single    Spouse name: Not on file   Number of children: Not on file   Years of education: Not on file   Highest education level: Not on file  Occupational History   Not on file  Tobacco Use   Smoking status: Former    Types: Cigarettes   Smokeless tobacco: Never  Substance and Sexual Activity   Alcohol use: Yes    Comment: socailly   Drug use: No   Sexual activity: Not on file  Other Topics Concern   Not on file  Social History Narrative   Not on file   Social Drivers of Health   Financial Resource Strain: Not on file  Food Insecurity: Not on file  Transportation Needs: Not on file  Physical Activity: Not on file  Stress: Not on file  Social Connections: Not on file  Intimate Partner Violence: Not on file      Review of Systems  Constitutional:  Positive for fatigue. Negative for chills and diaphoresis.  HENT:  Negative for ear pain, postnasal drip and sinus pressure.   Eyes:  Negative for photophobia, discharge, redness, itching and visual disturbance.  Respiratory:  Positive for shortness of breath and wheezing (intermittent). Negative for cough and chest tightness.   Cardiovascular:  Negative for chest pain, palpitations and leg swelling.  Gastrointestinal:  Negative for abdominal  pain, constipation, diarrhea, nausea and vomiting.  Genitourinary:  Negative for dysuria and flank pain.  Musculoskeletal:  Positive for arthralgias, neck pain and neck stiffness. Negative for back pain and gait problem.  Skin: Negative.  Negative for color change.  Allergic/Immunologic: Negative for environmental allergies and food allergies.  Neurological:  Positive for dizziness and headaches.  Hematological:  Does not bruise/bleed easily.  Psychiatric/Behavioral:  Positive for behavioral problems (depression). Negative for agitation, hallucinations, self-injury, sleep disturbance and suicidal ideas.     Vital Signs: BP 138/72   Pulse 90  Temp 98.1 F (36.7 C)   Resp 16   Ht 5' 3 (1.6 m)   Wt 193 lb 3.2 oz (87.6 kg)   SpO2 99%   BMI 34.22 kg/m    Physical Exam Vitals reviewed.  Constitutional:      General: She is not in acute distress.    Appearance: Normal appearance. She is obese. She is not ill-appearing.  HENT:     Head: Normocephalic and atraumatic.  Eyes:     Pupils: Pupils are equal, round, and reactive to light.  Cardiovascular:     Rate and Rhythm: Normal rate and regular rhythm.     Heart sounds: Normal heart sounds. No murmur heard. Pulmonary:     Effort: Pulmonary effort is normal. No respiratory distress.     Breath sounds: Normal breath sounds. No wheezing.  Skin:    General: Skin is warm and dry.     Capillary Refill: Capillary refill takes less than 2 seconds.  Neurological:     Mental Status: She is alert and oriented to person, place, and time.     Cranial Nerves: No cranial nerve deficit.     Coordination: Coordination normal.     Gait: Gait normal.  Psychiatric:        Mood and Affect: Mood normal.        Behavior: Behavior normal.        Assessment/Plan: 1. NAFLD (nonalcoholic fatty liver disease) (Primary) Routine labs ordered  - CBC with Differential/Platelet - CMP14+EGFR - Lipid Profile - Hgb A1C w/o eAG - Vitamin D (25  hydroxy)  2. Type 2 diabetes mellitus with other specified complication, without long-term current use of insulin (HCC) Routine labs ordered, will review labs and then determine if any medication needs to be added.  - CBC with Differential/Platelet - CMP14+EGFR - Lipid Profile - Hgb A1C w/o eAG  3. Hypertension associated with diabetes (HCC) Stable, continue amlodipine  and hydrochlorothiazide  as prescribed.  - amLODipine  (NORVASC ) 10 MG tablet; Take 1 tablet (10 mg total) by mouth daily.  Dispense: 90 tablet; Refill: 1 - hydrochlorothiazide  (HYDRODIURIL ) 25 MG tablet; Take 1 tablet (25 mg total) by mouth daily.  Dispense: 90 tablet; Refill: 1  4. Moderate persistent asthma, unspecified whether complicated Continue symbicort  twice daily.  - budesonide -formoterol  (SYMBICORT ) 80-4.5 MCG/ACT inhaler; Inhale 2 puffs into the lungs in the morning and at bedtime.  Dispense: 10.2 g; Refill: 5 - albuterol  (VENTOLIN  HFA) 108 (90 Base) MCG/ACT inhaler; Inhale 2 puffs into the lungs every 6 (six) hours as needed for wheezing or shortness of breath.  Dispense: 8 g; Refill: 5  5. Vertigo Referred to ENT, take meclizine  as needed.  - Ambulatory referral to ENT - meclizine  (ANTIVERT ) 25 MG tablet; Take 1 tablet (25 mg total) by mouth 2 (two) times daily as needed for dizziness.  Dispense: 30 tablet; Refill: 1  6. Hyperkalemia Repeat labs ordered  - CBC with Differential/Platelet - CMP14+EGFR - Lipid Profile  7. Acute left eye pain Referred to ophthalmology  - Ambulatory referral to Ophthalmology  8. Ptosis of left eyelid Referred to ophthalmology  - Ambulatory referral to Ophthalmology  9. Chronic tension-type headache, not intractable Routine labs ordered and fioricet prescribed.  - B12 and Folate Panel - butalbital -acetaminophen -caffeine  (FIORICET) 50-325-40 MG tablet; Take 1 tablet by mouth every 6 (six) hours as needed for headache.  Dispense: 14 tablet; Refill: 0  10. Chronic neck  pain Continue prn cyclobenzaprine  as prescribed.  - cyclobenzaprine  (FLEXERIL ) 10 MG tablet; Take  1 tablet (10 mg total) by mouth 3 (three) times daily as needed for muscle spasms.  Dispense: 60 tablet; Refill: 0  11. B12 deficiency Routine labs ordered  - CBC with Differential/Platelet - B12 and Folate Panel  12. Vitamin D deficiency Routine lab ordered - Vitamin D (25 hydroxy)   General Counseling: Melanie Briggs verbalizes understanding of the findings of todays visit and agrees with plan of treatment. I have discussed any further diagnostic evaluation that may be needed or ordered today. We also reviewed her medications today. she has been encouraged to call the office with any questions or concerns that should arise related to todays visit.    Orders Placed This Encounter  Procedures   CBC with Differential/Platelet   CMP14+EGFR   Lipid Profile   Hgb A1C w/o eAG   Vitamin D (25 hydroxy)   B12 and Folate Panel   Ambulatory referral to ENT   Ambulatory referral to Ophthalmology    Meds ordered this encounter  Medications   butalbital -acetaminophen -caffeine  (FIORICET) 50-325-40 MG tablet    Sig: Take 1 tablet by mouth every 6 (six) hours as needed for headache.    Dispense:  14 tablet    Refill:  0   amLODipine  (NORVASC ) 10 MG tablet    Sig: Take 1 tablet (10 mg total) by mouth daily.    Dispense:  90 tablet    Refill:  1    Note increased dose, discontinue previous amlodipine  5 mg tablet, fill new prescription immediately.   hydrochlorothiazide  (HYDRODIURIL ) 25 MG tablet    Sig: Take 1 tablet (25 mg total) by mouth daily.    Dispense:  90 tablet    Refill:  1   cyclobenzaprine  (FLEXERIL ) 10 MG tablet    Sig: Take 1 tablet (10 mg total) by mouth 3 (three) times daily as needed for muscle spasms.    Dispense:  60 tablet    Refill:  0   meclizine  (ANTIVERT ) 25 MG tablet    Sig: Take 1 tablet (25 mg total) by mouth 2 (two) times daily as needed for dizziness.    Dispense:   30 tablet    Refill:  1   budesonide -formoterol  (SYMBICORT ) 80-4.5 MCG/ACT inhaler    Sig: Inhale 2 puffs into the lungs in the morning and at bedtime.    Dispense:  10.2 g    Refill:  5   albuterol  (VENTOLIN  HFA) 108 (90 Base) MCG/ACT inhaler    Sig: Inhale 2 puffs into the lungs every 6 (six) hours as needed for wheezing or shortness of breath.    Dispense:  8 g    Refill:  5    For future refills    Return in about 3 weeks (around 08/03/2024) for F/U, Labs, Denya Buckingham PCP.   Total time spent:30 Minutes Time spent includes review of chart, medications, test results, and follow up plan with the patient.   [place] Controlled Substance Database was reviewed by me.  This patient was seen by Mardy Maxin, FNP-C in collaboration with Dr. Sigrid Bathe as a part of collaborative care agreement.   Mei Suits R. Maxin, MSN, FNP-C Internal medicine

## 2024-07-16 ENCOUNTER — Telehealth: Payer: Self-pay | Admitting: Nurse Practitioner

## 2024-07-16 NOTE — Telephone Encounter (Addendum)
 Awaiting 07/13/24 office notes for Urgent Otolaryngology & nonurgent Ophthalmology referral-Toni

## 2024-07-18 ENCOUNTER — Encounter: Payer: Self-pay | Admitting: Nurse Practitioner

## 2024-07-18 DIAGNOSIS — H5712 Ocular pain, left eye: Secondary | ICD-10-CM | POA: Insufficient documentation

## 2024-07-18 DIAGNOSIS — J4540 Moderate persistent asthma, uncomplicated: Secondary | ICD-10-CM | POA: Insufficient documentation

## 2024-07-18 DIAGNOSIS — G44229 Chronic tension-type headache, not intractable: Secondary | ICD-10-CM | POA: Insufficient documentation

## 2024-07-18 DIAGNOSIS — H02402 Unspecified ptosis of left eyelid: Secondary | ICD-10-CM | POA: Insufficient documentation

## 2024-07-18 DIAGNOSIS — R42 Dizziness and giddiness: Secondary | ICD-10-CM | POA: Insufficient documentation

## 2024-07-19 ENCOUNTER — Telehealth: Payer: Self-pay | Admitting: Nurse Practitioner

## 2024-07-19 NOTE — Telephone Encounter (Signed)
 Urgent Otolaryngology referral sent via [facility]. telephone # [phone].  Urgent Ophthalmology referral sent via [facility]. telephone # [phone].   Left vm notifying patient-Melanie Briggs

## 2024-07-25 ENCOUNTER — Telehealth: Payer: Self-pay | Admitting: Nurse Practitioner

## 2024-07-25 NOTE — Telephone Encounter (Signed)
 Ophthalmology appointment 07/27/2024 with [facility]-Toni

## 2024-08-03 ENCOUNTER — Ambulatory Visit: Admitting: Nurse Practitioner

## 2024-08-08 ENCOUNTER — Ambulatory Visit: Admitting: Nurse Practitioner

## 2024-08-24 ENCOUNTER — Ambulatory Visit: Admitting: Nurse Practitioner
